# Patient Record
Sex: Female | Born: 1995 | Race: Asian | Hispanic: No | Marital: Married | Smoking: Never smoker
Health system: Southern US, Community
[De-identification: ages and names within clinical notes are randomized; demographics above are authoritative.]

## PROBLEM LIST (undated history)

## (undated) ENCOUNTER — Inpatient Hospital Stay (HOSPITAL_COMMUNITY): Payer: Self-pay

## (undated) DIAGNOSIS — Z789 Other specified health status: Secondary | ICD-10-CM

## (undated) DIAGNOSIS — F32A Depression, unspecified: Secondary | ICD-10-CM

## (undated) HISTORY — DX: Depression, unspecified: F32.A

## (undated) HISTORY — PX: NO PAST SURGERIES: SHX2092

---
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
## 2016-04-27 ENCOUNTER — Encounter: Payer: Self-pay | Admitting: Family Medicine

## 2016-04-27 ENCOUNTER — Ambulatory Visit (INDEPENDENT_AMBULATORY_CARE_PROVIDER_SITE_OTHER): Payer: BLUE CROSS/BLUE SHIELD | Admitting: Primary Care

## 2016-04-27 ENCOUNTER — Encounter: Payer: Self-pay | Admitting: Primary Care

## 2016-04-27 ENCOUNTER — Ambulatory Visit (INDEPENDENT_AMBULATORY_CARE_PROVIDER_SITE_OTHER): Payer: BLUE CROSS/BLUE SHIELD | Admitting: Family Medicine

## 2016-04-27 VITALS — BP 110/73 | HR 80 | Ht 60.0 in | Wt 104.0 lb

## 2016-04-27 VITALS — BP 98/62 | HR 83 | Temp 98.2°F | Ht 60.0 in | Wt 104.1 lb

## 2016-04-27 DIAGNOSIS — Z113 Encounter for screening for infections with a predominantly sexual mode of transmission: Secondary | ICD-10-CM

## 2016-04-27 DIAGNOSIS — Z975 Presence of (intrauterine) contraceptive device: Secondary | ICD-10-CM | POA: Diagnosis not present

## 2016-04-27 DIAGNOSIS — R1010 Upper abdominal pain, unspecified: Secondary | ICD-10-CM | POA: Diagnosis not present

## 2016-04-27 DIAGNOSIS — Z01419 Encounter for gynecological examination (general) (routine) without abnormal findings: Secondary | ICD-10-CM | POA: Diagnosis not present

## 2016-04-27 DIAGNOSIS — Z0000 Encounter for general adult medical examination without abnormal findings: Secondary | ICD-10-CM

## 2016-04-27 LAB — COMPREHENSIVE METABOLIC PANEL
ALBUMIN: 4.7 g/dL (ref 3.5–5.2)
ALK PHOS: 42 U/L — AB (ref 47–119)
ALT: 18 U/L (ref 0–35)
AST: 19 U/L (ref 0–37)
BILIRUBIN TOTAL: 0.7 mg/dL (ref 0.2–1.2)
BUN: 12 mg/dL (ref 6–23)
CALCIUM: 9.7 mg/dL (ref 8.4–10.5)
CO2: 28 mEq/L (ref 19–32)
Chloride: 105 mEq/L (ref 96–112)
Creatinine, Ser: 0.62 mg/dL (ref 0.40–1.20)
GFR: 130.77 mL/min (ref >60.00)
Glucose, Bld: 86 mg/dL (ref 70–99)
Potassium: 4.4 mEq/L (ref 3.5–5.1)
Sodium: 139 mEq/L (ref 135–145)
TOTAL PROTEIN: 7.8 g/dL (ref 6.0–8.3)

## 2016-04-27 LAB — CBC WITH DIFFERENTIAL/PLATELET
BASOS ABS: 0 10*3/uL (ref 0.0–0.1)
Basophils Relative: 0.3 % (ref 0.0–3.0)
Eosinophils Absolute: 0.1 10*3/uL (ref 0.0–0.7)
Eosinophils Relative: 1.1 % (ref 0.0–5.0)
HEMATOCRIT: 42.9 % (ref 36.0–49.0)
Hemoglobin: 14.4 g/dL (ref 12.0–16.0)
LYMPHS ABS: 1.9 10*3/uL (ref 0.7–4.0)
LYMPHS PCT: 36.8 % (ref 24.0–48.0)
MCHC: 33.6 g/dL (ref 31.0–37.0)
MCV: 87.9 fl (ref 78.0–98.0)
MONOS PCT: 7.3 % (ref 3.0–12.0)
Monocytes Absolute: 0.4 10*3/uL (ref 0.1–1.0)
NEUTROS PCT: 54.5 % (ref 43.0–71.0)
Neutro Abs: 2.8 10*3/uL (ref 1.4–7.7)
Platelets: 204 10*3/uL (ref 150.0–575.0)
RBC: 4.88 Mil/uL (ref 3.80–5.70)
RDW: 12.3 % (ref 11.4–15.5)
WBC: 5.2 10*3/uL (ref 4.5–13.5)

## 2016-04-27 LAB — H. PYLORI ANTIBODY, IGG: H Pylori IgG: NEGATIVE

## 2016-04-27 LAB — LIPASE: Lipase: 28 U/L (ref 11.0–59.0)

## 2016-04-27 NOTE — Patient Instructions (Signed)
You are eligible for HPV vaccination which is the only vaccination against cancer, specifically cervical cancer. You can get this up to the age of 54. You can often get this at your primary care office or at the health department.    Human Papillomavirus Quadrivalent Vaccine suspension for injection What is this medicine? HUMAN PAPILLOMAVIRUS VACCINE (HYOO muhn pap uh LOH muh vahy ruhs vak SEEN) is a vaccine. It is used to prevent infections of four types of the human papillomavirus. In women, the vaccine may lower your risk of getting cervical, vaginal, vulvar, or anal cancer and genital warts. In men, the vaccine may lower your risk of getting genital warts and anal cancer. You cannot get these diseases from the vaccine. This vaccine does not treat these diseases. This medicine may be used for other purposes; ask your health care provider or pharmacist if you have questions. What should I tell my health care provider before I take this medicine? They need to know if you have any of these conditions: -fever or infection -hemophilia -HIV infection or AIDS -immune system problems -low platelet count -an unusual reaction to Human Papillomavirus Vaccine, yeast, other medicines, foods, dyes, or preservatives -pregnant or trying to get pregnant -breast-feeding How should I use this medicine? This vaccine is for injection in a muscle on your upper arm or thigh. It is given by a health care professional. Bonita Quin will be observed for 15 minutes after each dose. Sometimes, fainting happens after the vaccine is given. You may be asked to sit or lie down during the 15 minutes. Three doses are given. The second dose is given 2 months after the first dose. The last dose is given 4 months after the second dose. A copy of a Vaccine Information Statement will be given before each vaccination. Read this sheet carefully each time. The sheet may change frequently. Talk to your pediatrician regarding the use of this  medicine in children. While this drug may be prescribed for children as young as 64 years of age for selected conditions, precautions do apply. Overdosage: If you think you have taken too much of this medicine contact a poison control center or emergency room at once. NOTE: This medicine is only for you. Do not share this medicine with others. What if I miss a dose? All 3 doses of the vaccine should be given within 6 months. Remember to keep appointments for follow-up doses. Your health care provider will tell you when to return for the next vaccine. Ask your health care professional for advice if you are unable to keep an appointment or miss a scheduled dose. What may interact with this medicine? -other vaccines This list may not describe all possible interactions. Give your health care provider a list of all the medicines, herbs, non-prescription drugs, or dietary supplements you use. Also tell them if you smoke, drink alcohol, or use illegal drugs. Some items may interact with your medicine. What should I watch for while using this medicine? This vaccine may not fully protect everyone. Continue to have regular pelvic exams and cervical or anal cancer screenings as directed by your doctor. The Human Papillomavirus is a sexually transmitted disease. It can be passed by any kind of sexual activity that involves genital contact. The vaccine works best when given before you have any contact with the virus. Many people who have the virus do not have any signs or symptoms. Tell your doctor or health care professional if you have any reaction or unusual symptom  after getting the vaccine. What side effects may I notice from receiving this medicine? Side effects that you should report to your doctor or health care professional as soon as possible: -allergic reactions like skin rash, itching or hives, swelling of the face, lips, or tongue -breathing problems -feeling faint or lightheaded, falls Side effects  that usually do not require medical attention (report to your doctor or health care professional if they continue or are bothersome): -cough -dizziness -fever -headache -nausea -redness, warmth, swelling, pain, or itching at site where injected This list may not describe all possible side effects. Call your doctor for medical advice about side effects. You may report side effects to FDA at 1-800-FDA-1088. Where should I keep my medicine? This drug is given in a hospital or clinic and will not be stored at home. NOTE: This sheet is a summary. It may not cover all possible information. If you have questions about this medicine, talk to your doctor, pharmacist, or health care provider.    2016, Elsevier/Gold Standard. (2013-08-19 13:14:33)

## 2016-04-27 NOTE — Assessment & Plan Note (Signed)
Mostly to epigastric region x 3-4 years. Exam today unremarkable, does not appear acutely ill. Suspect either silent reflux or gastric ulcer. Will obtain CMP, CBC, Lipase, and H Pylori for further evaluation. Will also have her trial Tums for symptoms and notify if no improvement within 2-3 weeks.

## 2016-04-27 NOTE — Progress Notes (Signed)
   CLINIC ENCOUNTER NOTE  History:  20 y.o. G0P0000 here today for Chief Complaint  Patient presents with  . Gynecologic Exam    She denies any abnormal vaginal discharge, bleeding, pelvic pain or other concerns.   History reviewed. No pertinent past medical history.  History reviewed. No pertinent surgical history.  The following portions of the patient's history were reviewed and updated as appropriate: allergies, current medications, past family history, past medical history, past social history, past surgical history and problem list.   Health Maintenance:  Pap @21 .   Review of Systems:  Pertinent items noted in HPI and remainder of comprehensive ROS otherwise negative.  Objective:  Physical Exam BP 110/73   Pulse 80   Ht 5' (1.524 m)   Wt 104 lb (47.2 kg)   BMI 20.31 kg/m  CONSTITUTIONAL: Well-developed, well-nourished female in no acute distress.  HENT:  Normocephalic, atraumatic. External right and left ear normal. Oropharynx is clear and moist EYES: Conjunctivae and EOM are normal. Pupils are equal, round, and reactive to light. No scleral icterus.  NECK: Normal range of motion, supple, no masses SKIN: Skin is warm and dry. No rash noted. Not diaphoretic. No erythema. No pallor. NEUROLGIC: Alert and oriented to person, place, and time. Normal reflexes, muscle tone coordination. No cranial nerve deficit noted. PSYCHIATRIC: Normal mood and affect. Normal behavior. Normal judgment and thought content. CARDIOVASCULAR: Normal heart rate noted RESPIRATORY: Effort and breath sounds normal, no problems with respiration noted BREAST: normal CBE with normal breast tissue. No skin changes, nipple retraction, or nipple discharge.  ABDOMEN: Soft, no distention noted.   PELVIC: Normal appearing external genitalia; normal appearing vaginal mucosa and cervix.  No abnormal discharge noted.  Normal uterine size, no other palpable masses, no uterine or adnexal  tenderness. MUSCULOSKELETAL: Normal range of motion. No edema noted.  Labs and Imaging No results found.  Assessment & Plan:  1. Well woman exam with routine gynecological exam No indication for pap smear as patient is 20yo Recommended discussing HPV vaccination with PCP or at HD Reviewed reproductive life planning, wants pregnancy at [id number]. She is currently using Nexplanon for BCM.  Recommended starting MVI with folic acid Reviewed need for Td every 10 years, will have flu shot through work next week. Encouraged to call so that we can update Epic Desires STI screening today (HIV and CT) - HIV antibody (with reflex) - RPR - GC/Chlamydia probe amp ([place])not at [facility]  Routine preventative health maintenance measures emphasized. Please refer to After Visit Summary for other counseling recommendations.   Return in about 1 year (around 04/27/2017) for Yearly physical, Nexplanon removal and replacement.

## 2016-04-27 NOTE — Progress Notes (Signed)
Pre visit review using our clinic review tool, if applicable. No additional management support is needed unless otherwise documented below in the visit note. 

## 2016-04-27 NOTE — Progress Notes (Signed)
   Subjective:    Patient ID: Alicia Rich, female    DOB: 15-Sep-1995, 20 y.o.   MRN: [id number]  HPI  Alicia Rich is a 20 year old female who presents today to establish care and discuss the problems mentioned below. Will obtain old records.  1) Abdominal Pain: Intermittently present for the past 3-4 years. She will experience pain to the epigastric region intermittently within several minutes after eating and also when she hasn't eaten in a while. She describes her pain as "crampy". The pain will last for several minutes which will dissipate after laying down. She will occasionally experience nausea with vomiting with her pain. She has experienced esophageal burning in the past. She denies diarrhea, unexplained weight loss, fevers, rectal bleeding, changes in appetite.   Review of Systems  Constitutional: Negative for fatigue, fever and unexpected weight change.  Gastrointestinal: Positive for abdominal pain. Negative for constipation, diarrhea, nausea, rectal pain and vomiting.  Neurological: Negative for weakness.       No past medical history on file.   Social History   Social History  . Marital status: Married    Spouse name: N/A  . Number of children: N/A  . Years of education: N/A   Occupational History  . Not on file.   Social History Main Topics  . Smoking status: Never Smoker  . Smokeless tobacco: Not on file  . Alcohol use No  . Drug use: Unknown  . Sexual activity: Not on file   Other Topics Concern  . Not on file   Social History Narrative   Married.   No children.   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   She aspires to take classes for nursing or cosmetology.     No past surgical history on file.  Family History  Problem Relation Age of Onset  . Heart attack Maternal Grandfather     No Known Allergies  No current outpatient prescriptions on file prior to visit.   No current facility-administered medications on file prior to visit.     BP 98/62    Pulse 83   Temp 98.2 F (36.8 C) (Oral)   Ht 5' (1.524 m)   Wt 104 lb 1.9 oz (47.2 kg)   SpO2 99%   BMI 20.33 kg/m    Objective:   Physical Exam  Constitutional: She appears well-nourished.  Neck: Neck supple.  Cardiovascular: Normal rate and regular rhythm.   Pulmonary/Chest: Effort normal and breath sounds normal.  Abdominal: Soft. Bowel sounds are normal. There is no tenderness.  Skin: Skin is warm and dry.  Psychiatric: She has a normal mood and affect.          Assessment & Plan:

## 2016-04-27 NOTE — Patient Instructions (Signed)
Your pain and symptoms could be related to silent esophageal reflux.  Start by purchasing a bottle of Tum's (antacid). Take according to the package instructions at onset of your pain. Do this for 3-4 weeks. Also avoid foods that could trigger these symptoms, take a look at the information below.  If the Tum's do not work within 3 weeks or if you need them on a daily basis then please e-mail me.   Complete lab work prior to leaving today. I will notify you of your results once received.   It was a pleasure to meet you today! Please don't hesitate to call me with any questions. Welcome to [facility]!  Food Choices for Gastroesophageal Reflux Disease, Adult When you have gastroesophageal reflux disease (GERD), the foods you eat and your eating habits are very important. Choosing the right foods can help ease the discomfort of GERD. WHAT GENERAL GUIDELINES DO I NEED TO FOLLOW?  Choose fruits, vegetables, whole grains, low-fat dairy products, and low-fat meat, fish, and poultry.  Limit fats such as oils, salad dressings, butter, nuts, and avocado.  Keep a food diary to identify foods that cause symptoms.  Avoid foods that cause reflux. These may be different for different people.  Eat frequent small meals instead of three large meals each day.  Eat your meals slowly, in a relaxed setting.  Limit fried foods.  Cook foods using methods other than frying.  Avoid drinking alcohol.  Avoid drinking large amounts of liquids with your meals.  Avoid bending over or lying down until 2-3 hours after eating. WHAT FOODS ARE NOT RECOMMENDED? The following are some foods and drinks that may worsen your symptoms: Vegetables Tomatoes. Tomato juice. Tomato and spaghetti sauce. Chili peppers. Onion and garlic. Horseradish. Fruits Oranges, grapefruit, and lemon (fruit and juice). Meats High-fat meats, fish, and poultry. This includes hot dogs, ribs, ham, sausage, salami, and bacon. Dairy Whole  milk and chocolate milk. Sour cream. Cream. Butter. Ice cream. Cream cheese.  Beverages Coffee and tea, with or without caffeine. Carbonated beverages or energy drinks. Condiments Hot sauce. Barbecue sauce.  Sweets/Desserts Chocolate and cocoa. Donuts. Peppermint and spearmint. Fats and Oils High-fat foods, including [place] fries and potato chips. Other Vinegar. Strong spices, such as black pepper, white pepper, red pepper, cayenne, curry powder, cloves, ginger, and chili powder. The items listed above may not be a complete list of foods and beverages to avoid. Contact your dietitian for more information.   This information is not intended to replace advice given to you by your health care provider. Make sure you discuss any questions you have with your health care provider.   Document Released: 06/27/2005 Document Revised: 07/18/2014 Document Reviewed: 05/01/2013 Elsevier Interactive Patient Education [employer]2016 Elsevier Inc.

## 2016-04-28 LAB — RPR

## 2016-04-28 LAB — HIV ANTIBODY (ROUTINE TESTING W REFLEX): HIV 1&2 Ab, 4th Generation: NONREACTIVE

## 2016-05-02 LAB — GC/CHLAMYDIA PROBE AMP (~~LOC~~) NOT AT ARMC
Chlamydia: NEGATIVE
Neisseria Gonorrhea: NEGATIVE

## 2016-05-11 ENCOUNTER — Telehealth: Payer: Self-pay | Admitting: Primary Care

## 2016-05-11 NOTE — Telephone Encounter (Signed)
-----   Message from Doreene NestKatherine K Jobeth Pangilinan, NP sent at 04/27/2016 10:59 AM EDT ----- Regarding: Abdominal Pain Please check on patient's abdominal pain. Any improvement with Tums?

## 2016-05-12 NOTE — Telephone Encounter (Signed)
Message left for patient to return my call.  

## 2016-05-13 NOTE — Telephone Encounter (Signed)
Message left for patient to return my call.  

## 2016-10-23 ENCOUNTER — Inpatient Hospital Stay (HOSPITAL_COMMUNITY)
Admission: AD | Admit: 2016-10-23 | Discharge: 2016-10-23 | Disposition: A | Discharge status: Home / Self Care | Payer: BLUE CROSS/BLUE SHIELD | Source: Ambulatory Visit | Attending: Obstetrics & Gynecology | Admitting: Obstetrics & Gynecology

## 2016-10-23 ENCOUNTER — Encounter (HOSPITAL_COMMUNITY): Payer: Self-pay

## 2016-10-23 DIAGNOSIS — Z978 Presence of other specified devices: Secondary | ICD-10-CM | POA: Diagnosis not present

## 2016-10-23 DIAGNOSIS — N939 Abnormal uterine and vaginal bleeding, unspecified: Secondary | ICD-10-CM | POA: Insufficient documentation

## 2016-10-23 DIAGNOSIS — N921 Excessive and frequent menstruation with irregular cycle: Secondary | ICD-10-CM | POA: Diagnosis not present

## 2016-10-23 DIAGNOSIS — Z79899 Other long term (current) drug therapy: Secondary | ICD-10-CM | POA: Diagnosis not present

## 2016-10-23 DIAGNOSIS — Z975 Presence of (intrauterine) contraceptive device: Secondary | ICD-10-CM

## 2016-10-23 LAB — URINALYSIS, ROUTINE W REFLEX MICROSCOPIC
Bacteria, UA: NONE SEEN
Bilirubin Urine: NEGATIVE
GLUCOSE, UA: NEGATIVE mg/dL
Ketones, ur: NEGATIVE mg/dL
Leukocytes, UA: NEGATIVE
NITRITE: NEGATIVE
PH: 6 (ref 5.0–8.0)
PROTEIN: NEGATIVE mg/dL
SPECIFIC GRAVITY, URINE: 1.023 (ref 1.005–1.030)

## 2016-10-23 LAB — CBC
HEMATOCRIT: 39.7 % (ref 36.0–46.0)
HEMOGLOBIN: 13.2 g/dL (ref 12.0–15.0)
MCH: 29.9 pg (ref 26.0–34.0)
MCHC: 33.2 g/dL (ref 30.0–36.0)
MCV: 90 fL (ref 78.0–100.0)
Platelets: 215 10*3/uL (ref 150–400)
RBC: 4.41 MIL/uL (ref 3.87–5.11)
RDW: 12.2 % (ref 11.5–15.5)
WBC: 4.9 10*3/uL (ref 4.0–10.5)

## 2016-10-23 LAB — POCT PREGNANCY, URINE: Preg Test, Ur: NEGATIVE

## 2016-10-23 MED ORDER — NORGESTIMATE-ETH ESTRADIOL 0.25-35 MG-MCG PO TABS: 1.0000 | ORAL_TABLET | Freq: Every day | ORAL | 1 refills | Status: DC

## 2016-10-23 NOTE — MAU Note (Signed)
Patient presents with vaginal bleeding for almost 2 months, has history of irregular bleeding since implant

## 2016-10-23 NOTE — MAU Provider Note (Signed)
Chief Complaint:  Vaginal Bleeding   First Provider Initiated Contact with Patient 10/23/16 1037      HPI: Alicia Rich is a 21 y.o. G0P0000 who presents to maternity admissions reporting prolonged vaginal bleeding. Has a Nexplanon in place for 2 years and has had irregular bleeding before but it never lasted this long.  .She reports vaginal bleeding, but no vaginal itching/burning, urinary symptoms, h/a, dizziness, n/v, or fever/chills.    Vaginal Bleeding  The patient's primary symptoms include vaginal bleeding. The patient's pertinent negatives include no genital itching, genital lesions, genital odor or pelvic pain. This is a recurrent problem. The current episode started more than 1 month ago. The problem occurs constantly. The problem has been unchanged. The patient is experiencing no pain. She is not pregnant. Pertinent negatives include no abdominal pain, back pain, constipation, diarrhea, dysuria, fever, headaches, nausea or vomiting. The vaginal discharge was bloody. The vaginal bleeding is typical of menses. She has not been passing clots. She has not been passing tissue. Nothing aggravates the symptoms. She has tried nothing for the symptoms. Her menstrual history has been irregular.   RN Note: Patient presents with vaginal bleeding for almost 2 months, has history of irregular bleeding since implant  Past Medical History: History reviewed. No pertinent past medical history.  Past obstetric history: OB History  Gravida Para Term Preterm AB Living  0 0 0 0 0 0  SAB TAB Ectopic Multiple Live Births  0 0 0 0 0        Past Surgical History: History reviewed. No pertinent surgical history.  Family History: Family History  Problem Relation Age of Onset  . Heart attack Maternal Grandfather     Social History: Social History  Substance Use Topics  . Smoking status: Never Smoker  . Smokeless tobacco: Never Used  . Alcohol use No    Allergies: No Known  Allergies  Meds:  Prescriptions Prior to Admission  Medication Sig Dispense Refill Last Dose  . acetaminophen (TYLENOL) 500 MG tablet Take 500 mg by mouth every 6 (six) hours as needed.   10/21/2016  . etonogestrel (NEXPLANON) 68 MG IMPL implant 1 each by Subdermal route once.   10/23/2016 at Unknown time    I have reviewed patient's Past Medical Hx, Surgical Hx, Family Hx, Social Hx, medications and allergies.  ROS:  Review of Systems  Constitutional: Negative for fever.  Gastrointestinal: Negative for abdominal pain, constipation, diarrhea, nausea and vomiting.  Genitourinary: Positive for vaginal bleeding. Negative for dysuria and pelvic pain.  Musculoskeletal: Negative for back pain.  Neurological: Negative for headaches.   Other systems negative     Physical Exam  Patient Vitals for the past 24 hrs:  BP Temp Temp src Pulse Resp Height Weight  10/23/16 0953 109/71 98.1 F (36.7 C) Oral 83 16 - -  10/23/16 0944 - - - - - 5' (1.524 m) 111 lb 0.6 oz (50.4 kg)   Constitutional: Well-developed, well-nourished female in no acute distress.  Cardiovascular: normal rate and rhythm, no ectopy audible, S1 & S2 heard, no murmur Respiratory: normal effort, no distress. Lungs CTAB with no wheezes or crackles GI: Abd soft, non-tender.  Nondistended.  No rebound, No guarding.  Bowel Sounds audible  MS: Extremities nontender, no edema, normal ROM Neurologic: Alert and oriented x 4.   Grossly nonfocal. GU: Neg CVAT. Skin:  Warm and Dry Psych:  Affect appropriate.  PELVIC EXAM: Cervix pink, visually closed, without lesion, small amount of bloody discharge, vaginal walls  and external genitalia normal Bimanual exam: Cervix firm, anterior, neg CMT, uterus nontender, nonenlarged, adnexa without tenderness, enlargement, or mass    Labs: Results for orders placed or performed during the hospital encounter of 10/23/16 (from the past 24 hour(s))  Urinalysis, Routine w reflex microscopic      Status: Abnormal   Collection Time: 10/23/16  9:45 AM  Result Value Ref Range   Color, Urine YELLOW YELLOW   APPearance CLEAR CLEAR   Specific Gravity, Urine 1.023 1.005 - 1.030   pH 6.0 5.0 - 8.0   Glucose, UA NEGATIVE NEGATIVE mg/dL   Hgb urine dipstick LARGE (A) NEGATIVE   Bilirubin Urine NEGATIVE NEGATIVE   Ketones, ur NEGATIVE NEGATIVE mg/dL   Protein, ur NEGATIVE NEGATIVE mg/dL   Nitrite NEGATIVE NEGATIVE   Leukocytes, UA NEGATIVE NEGATIVE   RBC / HPF 0-5 0 - 5 RBC/hpf   WBC, UA 0-5 0 - 5 WBC/hpf   Bacteria, UA NONE SEEN NONE SEEN   Squamous Epithelial / LPF 0-5 (A) NONE SEEN   Mucous PRESENT   Pregnancy, urine POC     Status: None   Collection Time: 10/23/16  9:51 AM  Result Value Ref Range   Preg Test, Ur NEGATIVE NEGATIVE  CBC     Status: None   Collection Time: 10/23/16 10:03 AM  Result Value Ref Range   WBC 4.9 4.0 - 10.5 K/uL   RBC 4.41 3.87 - 5.11 MIL/uL   Hemoglobin 13.2 12.0 - 15.0 g/dL   HCT 40.9 81.1 - 91.4 %   MCV 90.0 78.0 - 100.0 fL   MCH 29.9 26.0 - 34.0 pg   MCHC 33.2 30.0 - 36.0 g/dL   RDW 78.2 95.6 - 21.3 %   Platelets 215 150 - 400 K/uL      Imaging:  No results found.  MAU Course/MDM: I have ordered labs as follows: CBC due to prolonged bleeding, UA Imaging ordered: none Results reviewed.   Hemoglobin is good, so no need for transfusion. Reviewed irreg bleeding with Nexplanon can be normal Will try a month of OCPs to stabilize lining  Pt stable at time of discharge.  Assessment: Breakthrough bleeding on Nexplanon - Plan: Discharge patient  Nexplanon in place - Plan: Discharge patient   Plan: Discharge home Recommend patience with regimen.  Let clinic know if bleeding does not stop Rx sent for Sprintec for breakthrough bleeding   Encouraged to return here or to other Urgent Care/ED if she develops worsening of symptoms, increase in pain, fever, or other concerning symptoms.   Wynelle Bourgeois CNM, MSN Certified  Nurse-Midwife 10/23/2016 10:37 AM

## 2016-10-23 NOTE — Discharge Instructions (Signed)
Oral Contraception Use Oral contraceptive pills (OCPs) are medicines taken to prevent pregnancy. OCPs work by preventing the ovaries from releasing eggs. The hormones in OCPs also cause the cervical mucus to thicken, preventing the sperm from entering the uterus. The hormones also cause the uterine lining to become thin, not allowing a fertilized egg to attach to the inside of the uterus. OCPs are highly effective when taken exactly as prescribed. However, OCPs do not prevent sexually transmitted diseases (STDs). Safe sex practices, such as using condoms along with an OCP, can help prevent STDs. Before taking OCPs, you may have a physical exam and Pap test. Your health care provider may also order blood tests if necessary. Your health care provider will make sure you are a good candidate for oral contraception. Discuss with your health care provider the possible side effects of the OCP you may be prescribed. When starting an OCP, it can take 2 to 3 months for the body to adjust to the changes in hormone levels in your body. How to take oral contraceptive pills Your health care provider may advise you on how to start taking the first cycle of OCPs. Otherwise, you can:  Start on day 1 of your menstrual period. You will not need any backup contraceptive protection with this start time.  Start on the first Sunday after your menstrual period or the day you get your prescription. In these cases, you will need to use backup contraceptive protection for the first week.  Start the pill at any time of your cycle. If you take the pill within 5 days of the start of your period, you are protected against pregnancy right away. In this case, you will not need a backup form of birth control. If you start at any other time of your menstrual cycle, you will need to use another form of birth control for 7 days. If your OCP is the type called a minipill, it will protect you from pregnancy after taking it for 2 days (48  hours). After you have started taking OCPs:  If you forget to take 1 pill, take it as soon as you remember. Take the next pill at the regular time.  If you miss 2 or more pills, call your health care provider because different pills have different instructions for missed doses. Use backup birth control until your next menstrual period starts.  If you use a 28-day pack that contains inactive pills and you miss 1 of the last 7 pills (pills with no hormones), it will not matter. Throw away the rest of the non-hormone pills and start a new pill pack. No matter which day you start the OCP, you will always start a new pack on that same day of the week. Have an extra pack of OCPs and a backup contraceptive method available in case you miss some pills or lose your OCP pack. Follow these instructions at home:  Do not smoke.  Always use a condom to protect against STDs. OCPs do not protect against STDs.  Use a calendar to mark your menstrual period days.  Read the information and directions that came with your OCP. Talk to your health care provider if you have questions. Contact a health care provider if:  You develop nausea and vomiting.  You have abnormal vaginal discharge or bleeding.  You develop a rash.  You miss your menstrual period.  You are losing your hair.  You need treatment for mood swings or depression.  You get dizzy  when taking the OCP.  You develop acne from taking the OCP.  You become pregnant. Get help right away if:  You develop chest pain.  You develop shortness of breath.  You have an uncontrolled or severe headache.  You develop numbness or slurred speech.  You develop visual problems.  You develop pain, redness, and swelling in the legs. This information is not intended to replace advice given to you by your health care provider. Make sure you discuss any questions you have with your health care provider. Document Released: 06/16/2011 Document Revised:  12/03/2015 Document Reviewed: 12/16/2012 Elsevier Interactive Patient Education  2017 [employer] Abnormal Uterine Bleeding Abnormal uterine bleeding means bleeding more than usual from your uterus. It can include:  Bleeding between periods.  Bleeding after sex.  Bleeding that is heavier than normal.  Periods that last longer than usual.  Bleeding after you have stopped having your period (menopause). There are many problems that may cause this. You should see a doctor for any kind of bleeding that is not normal. Treatment depends on the cause of the bleeding. Follow these instructions at home:  Watch your condition for any changes.  Do not use tampons, douche, or have sex, if your doctor tells you not to.  Change your pads often.  Get regular well-woman exams. Make sure they include a pelvic exam and cervical cancer screening.  Keep all follow-up visits as told by your doctor. This is important. Contact a doctor if:  The bleeding lasts more than one week.  You feel dizzy at times.  You feel like you are going to throw up (nauseous).  You throw up. Get help right away if:  You pass out.  You have to change pads every hour.  You have belly (abdominal) pain.  You have a fever.  You get sweaty.  You get weak.  You passing large blood clots from your vagina. Summary  Abnormal uterine bleeding means bleeding more than usual from your uterus.  There are many problems that may cause this. You should see a doctor for any kind of bleeding that is not normal.  Treatment depends on the cause of the bleeding. This information is not intended to replace advice given to you by your health care provider. Make sure you discuss any questions you have with your health care provider. Document Released: 04/24/2009 Document Revised: 06/21/2016 Document Reviewed: 06/21/2016 Elsevier Interactive Patient Education  2017 [employer].

## 2016-12-09 ENCOUNTER — Ambulatory Visit (INDEPENDENT_AMBULATORY_CARE_PROVIDER_SITE_OTHER): Payer: BLUE CROSS/BLUE SHIELD | Admitting: Obstetrics and Gynecology

## 2016-12-09 ENCOUNTER — Encounter: Payer: Self-pay | Admitting: Obstetrics and Gynecology

## 2016-12-09 VITALS — BP 122/75 | HR 90 | Resp 18 | Ht 61.0 in | Wt 110.0 lb

## 2016-12-09 DIAGNOSIS — Z3046 Encounter for surveillance of implantable subdermal contraceptive: Secondary | ICD-10-CM

## 2016-12-09 MED ORDER — NORGESTIMATE-ETH ESTRADIOL 0.25-35 MG-MCG PO TABS: 1.0000 | ORAL_TABLET | Freq: Every day | ORAL | 3 refills | Status: DC

## 2016-12-09 NOTE — Procedures (Signed)
Nexplanon Removal Procedure Note Patient has had nexplanon for about two years and had some AUB but very minor and rare, until she went to [facility] in mid April. At that time, she had been having constant AUB and she was put on Sprintec, which she states helped and she had a regular period on the placebo week. She'd like to just do OCPs and remove the Nexplanon. No h/o migraines, h/o VTE, FHx VTE.  Prior to the procedure being performed, the patient was asked to state their full name, date of birth, type of procedure being performed and the exact location of the operative site. This information was then checked against the documentation in the patient's chart. Prior to the procedure being performed, a "time out" was performed by the physician that confirmed the correct patient, procedure and site.  After informed consent was obtained, the Nexplanon was easily palpable at the prior insertion site. The area was swabbed with alcohol and injected with 3mL of 1% lidocaine with epi. It was then swabbed with betadine and sterile gloves put on. An 11 blade incision was made at the old site. The nexplanon was easily brought up to the surface the capsule scrapped off with the blade and then easily removed with the hemostat; it was inspected and noted to be intact.  Steri strip placed and 2x2 with pressure bandage.   Patient told to do OCPs x 1 week since Nexplanon is out before considering it effective.   The patient tolerated the procedure well.  Cornelia Copaharlie Hillery Zachman, Jr MD Attending [facility](Faculty Practice)

## 2017-07-26 ENCOUNTER — Ambulatory Visit (INDEPENDENT_AMBULATORY_CARE_PROVIDER_SITE_OTHER): Payer: BLUE CROSS/BLUE SHIELD | Admitting: Obstetrics and Gynecology

## 2017-07-26 ENCOUNTER — Encounter: Payer: Self-pay | Admitting: Obstetrics and Gynecology

## 2017-07-26 VITALS — BP 99/67 | HR 87 | Wt 107.2 lb

## 2017-07-26 DIAGNOSIS — Z124 Encounter for screening for malignant neoplasm of cervix: Secondary | ICD-10-CM | POA: Diagnosis not present

## 2017-07-26 DIAGNOSIS — Z01419 Encounter for gynecological examination (general) (routine) without abnormal findings: Secondary | ICD-10-CM

## 2017-07-26 NOTE — Progress Notes (Signed)
ytol

## 2017-07-27 LAB — CYTOLOGY - PAP: Diagnosis: NEGATIVE

## 2017-07-29 NOTE — Progress Notes (Signed)
GYNECOLOGY CLINIC ANNUAL PREVENTATIVE CARE ENCOUNTER NOTE  Subjective:   Kaidance Pantoja is a 22 y.o. G0P0000 female here for a routine annual gynecologic exam.  Current complaints: none.  She had a Nexplanon, because she and her spouse desire to get pregnant this year. Denies abnormal vaginal bleeding, discharge, pelvic pain, problems with intercourse or other gynecologic concerns.    Gynecologic History LMP: 07/19/2017 per pt.  Patient has had an implant. Contraception: none Last Pap: never had. Results were: n/a Last mammogram: never had. Results were: n/a  Obstetric History OB History  Gravida Para Term Preterm AB Living  0 0 0 0 0 0  SAB TAB Ectopic Multiple Live Births  0 0 0 0 0        History reviewed. No pertinent past medical history.  History reviewed. No pertinent surgical history.  Current Outpatient Medications on File Prior to Visit  Medication Sig Dispense Refill  . acetaminophen (TYLENOL) 500 MG tablet Take 500 mg by mouth every 6 (six) hours as needed.    . norgestimate-ethinyl estradiol (ORTHO-CYCLEN,SPRINTEC,PREVIFEM) 0.25-35 MG-MCG tablet Take 1 tablet by mouth daily. (Patient not taking: Reported on 07/26/2017) 3 Package 3   No current facility-administered medications on file prior to visit.     No Known Allergies  Social History   Socioeconomic History  . Marital status: Married    Spouse name: Earna Coder  . Number of children: Not on file  . Years of education: Not on file  . Highest education level: Not on file  Social Needs  . Financial resource strain: Not on file  . Food insecurity - worry: Not on file  . Food insecurity - inability: Not on file  . Transportation needs - medical: Not on file  . Transportation needs - non-medical: Not on file  Occupational History  . Not on file  Tobacco Use  . Smoking status: Never Smoker  . Smokeless tobacco: Never Used  Substance and Sexual Activity  . Alcohol use: No  . Drug use: No  . Sexual  activity: Yes    Birth control/protection: Implant  Other Topics Concern  . Not on file  Social History Narrative   Married.   No children.   Works as a Lawyer for [employer].   She aspires to take classes for nursing or cosmetology.     Family History  Problem Relation Age of Onset  . Heart attack Maternal Grandfather     The following portions of the patient's history were reviewed and updated as appropriate: allergies, current medications, past family history, past medical history, past social history, past surgical history and problem list.  Review of Systems Constitutional: negative Eyes: negative Ears, nose, mouth, throat, and face: negative Respiratory: negative Cardiovascular: negative Gastrointestinal: negative Genitourinary:negative Integument/breast: negative Hematologic/lymphatic: negative Musculoskeletal:negative Neurological: negative Behavioral/Psych: negative Endocrine: negative Allergic/Immunologic: negative   Objective:  BP 99/67   Pulse 87   Wt 107 lb 3.2 oz (48.6 kg)   BMI 20.26 kg/m  CONSTITUTIONAL: Well-developed, well-nourished female in no acute distress.  HENT:  Normocephalic, atraumatic, External right and left ear normal. Oropharynx is clear and moist EYES: Conjunctivae and EOM are normal. Pupils are equal, round, and reactive to light. No scleral icterus.  NECK: Normal range of motion, supple, no masses.  Normal thyroid.  SKIN: Skin is warm and dry. No rash noted. Not diaphoretic. No erythema. No pallor. NEUROLGIC: Alert and oriented to person, place, and time. Normal reflexes, muscle tone coordination. No cranial nerve deficit noted. PSYCHIATRIC: Normal  mood and affect. Normal behavior. Normal judgment and thought content. CARDIOVASCULAR: Normal heart rate noted, regular rhythm RESPIRATORY: Clear to auscultation bilaterally. Effort and breath sounds normal, no problems with respiration noted. BREASTS: Symmetric in size. No masses, skin  changes, nipple drainage, or lymphadenopathy. ABDOMEN: Soft, normal bowel sounds, no distention noted.  No tenderness, rebound or guarding.  PELVIC: Normal appearing external genitalia; normal appearing vaginal mucosa and cervix.  No abnormal discharge noted.  Pap smear obtained.  Normal uterine size, no other palpable masses, no uterine or adnexal tenderness. MUSCULOSKELETAL: Normal range of motion. No tenderness.  No cyanosis, clubbing, or edema.  2+ distal pulses.   Assessment:  Annual gynecologic examination with pap smear   Plan:   Will follow up results of pap smear and manage accordingly. Mammogram not needed d/t age Routine preventative health maintenance measures emphasized. Advised to call office with (+) HPT. Please refer to After Visit Summary for other counseling recommendations.   Raelyn MoraRolitta Jaice Lague, CNM  07/26/2017 4:00 PM

## 2017-08-01 ENCOUNTER — Ambulatory Visit: Payer: Self-pay | Admitting: Nurse Practitioner

## 2017-08-01 VITALS — BP 108/78 | HR 98 | Temp 98.0°F | Resp 20 | Ht 61.5 in | Wt 108.6 lb

## 2017-08-01 DIAGNOSIS — Z111 Encounter for screening for respiratory tuberculosis: Secondary | ICD-10-CM

## 2017-08-01 DIAGNOSIS — Z0000 Encounter for general adult medical examination without abnormal findings: Secondary | ICD-10-CM

## 2017-08-01 NOTE — Patient Instructions (Addendum)
Health Maintenance, Female Adopting a healthy lifestyle and getting preventive care can go a long way to promote health and wellness. Talk with your health care provider about what schedule of regular examinations is right for you. This is a good chance for you to check in with your provider about disease prevention and staying healthy. In between checkups, there are plenty of things you can do on your own. Experts have done a lot of research about which lifestyle changes and preventive measures are most likely to keep you healthy. Ask your health care provider for more information. Weight and diet Eat a healthy diet  Be sure to include plenty of vegetables, fruits, low-fat dairy products, and lean protein.  Do not eat a lot of foods high in solid fats, added sugars, or salt.  Get regular exercise. This is one of the most important things you can do for your health. ? Most adults should exercise for at least 150 minutes each week. The exercise should increase your heart rate and make you sweat (moderate-intensity exercise). ? Most adults should also do strengthening exercises at least twice a week. This is in addition to the moderate-intensity exercise.  Maintain a healthy weight  Body mass index (BMI) is a measurement that can be used to identify possible weight problems. It estimates body fat based on height and weight. Your health care provider can help determine your BMI and help you achieve or maintain a healthy weight.  For females 20 years of age and older: ? A BMI below 18.5 is considered underweight. ? A BMI of 18.5 to 24.9 is normal. ? A BMI of 25 to 29.9 is considered overweight. ? A BMI of 30 and above is considered obese.  Watch levels of cholesterol and blood lipids  You should start having your blood tested for lipids and cholesterol at 20 years of age, then have this test every 5 years.  You may need to have your cholesterol levels checked more often if: ? Your lipid or  cholesterol levels are high. ? You are older than 21 years of age. ? You are at high risk for heart disease.  Cancer screening Lung Cancer  Lung cancer screening is recommended for adults 55-80 years old who are at high risk for lung cancer because of a history of smoking.  A yearly low-dose CT scan of the lungs is recommended for people who: ? Currently smoke. ? Have quit within the past 15 years. ? Have at least a 30-pack-year history of smoking. A pack year is smoking an average of one pack of cigarettes a day for 1 year.  Yearly screening should continue until it has been 15 years since you quit.  Yearly screening should stop if you develop a health problem that would prevent you from having lung cancer treatment.  Breast Cancer  Practice breast self-awareness. This means understanding how your breasts normally appear and feel.  It also means doing regular breast self-exams. Let your health care provider know about any changes, no matter how small.  If you are in your 20s or 30s, you should have a clinical breast exam (CBE) by a health care provider every 1-3 years as part of a regular health exam.  If you are 40 or older, have a CBE every year. Also consider having a breast X-ray (mammogram) every year.  If you have a family history of breast cancer, talk to your health care provider about genetic screening.  If you are at high risk   for breast cancer, talk to your health care provider about having an MRI and a mammogram every year.  Breast cancer gene (BRCA) assessment is recommended for women who have family members with BRCA-related cancers. BRCA-related cancers include: ? Breast. ? Ovarian. ? Tubal. ? Peritoneal cancers.  Results of the assessment will determine the need for genetic counseling and BRCA1 and BRCA2 testing.  Cervical Cancer Your health care provider may recommend that you be screened regularly for cancer of the pelvic organs (ovaries, uterus, and  vagina). This screening involves a pelvic examination, including checking for microscopic changes to the surface of your cervix (Pap test). You may be encouraged to have this screening done every 3 years, beginning at age 22.  For women ages 56-65, health care providers may recommend pelvic exams and Pap testing every 3 years, or they may recommend the Pap and pelvic exam, combined with testing for human papilloma virus (HPV), every 5 years. Some types of HPV increase your risk of cervical cancer. Testing for HPV may also be done on women of any age with unclear Pap test results.  Other health care providers may not recommend any screening for nonpregnant women who are considered low risk for pelvic cancer and who do not have symptoms. Ask your health care provider if a screening pelvic exam is right for you.  If you have had past treatment for cervical cancer or a condition that could lead to cancer, you need Pap tests and screening for cancer for at least 20 years after your treatment. If Pap tests have been discontinued, your risk factors (such as having a new sexual partner) need to be reassessed to determine if screening should resume. Some women have medical problems that increase the chance of getting cervical cancer. In these cases, your health care provider may recommend more frequent screening and Pap tests.  Colorectal Cancer  This type of cancer can be detected and often prevented.  Routine colorectal cancer screening usually begins at 21 years of age and continues through 21 years of age.  Your health care provider may recommend screening at an earlier age if you have risk factors for colon cancer.  Your health care provider may also recommend using home test kits to check for hidden blood in the stool.  A small camera at the end of a tube can be used to examine your colon directly (sigmoidoscopy or colonoscopy). This is done to check for the earliest forms of colorectal  cancer.  Routine screening usually begins at age 33.  Direct examination of the colon should be repeated every 5-10 years through 21 years of age. However, you may need to be screened more often if early forms of precancerous polyps or small growths are found.  Skin Cancer  Check your skin from head to toe regularly.  Tell your health care provider about any new moles or changes in moles, especially if there is a change in a mole's shape or color.  Also tell your health care provider if you have a mole that is larger than the size of a pencil eraser.  Always use sunscreen. Apply sunscreen liberally and repeatedly throughout the day.  Protect yourself by wearing long sleeves, pants, a wide-brimmed hat, and sunglasses whenever you are outside.  Heart disease, diabetes, and high blood pressure  High blood pressure causes heart disease and increases the risk of stroke. High blood pressure is more likely to develop in: ? People who have blood pressure in the high end of  the normal range (130-139/85-89 mm Hg). ? People who are overweight or obese. ? People who are African American.  If you are 21-29 years of age, have your blood pressure checked every 3-5 years. If you are 3 years of age or older, have your blood pressure checked every year. You should have your blood pressure measured twice-once when you are at a hospital or clinic, and once when you are not at a hospital or clinic. Record the average of the two measurements. To check your blood pressure when you are not at a hospital or clinic, you can use: ? An automated blood pressure machine at a pharmacy. ? A home blood pressure monitor.  If you are between 17 years and 37 years old, ask your health care provider if you should take aspirin to prevent strokes.  Have regular diabetes screenings. This involves taking a blood sample to check your fasting blood sugar level. ? If you are at a normal weight and have a low risk for diabetes,  have this test once every three years after 21 years of age. ? If you are overweight and have a high risk for diabetes, consider being tested at a younger age or more often. Preventing infection Hepatitis B  If you have a higher risk for hepatitis B, you should be screened for this virus. You are considered at high risk for hepatitis B if: ? You were born in a country where hepatitis B is common. Ask your health care provider which countries are considered high risk. ? Your parents were born in a high-risk country, and you have not been immunized against hepatitis B (hepatitis B vaccine). ? You have HIV or AIDS. ? You use needles to inject street drugs. ? You live with someone who has hepatitis B. ? You have had sex with someone who has hepatitis B. ? You get hemodialysis treatment. ? You take certain medicines for conditions, including cancer, organ transplantation, and autoimmune conditions.  Hepatitis C  Blood testing is recommended for: ? Everyone born from [birth year] through 1964. ? Anyone with known risk factors for hepatitis C.  Sexually transmitted infections (STIs)  You should be screened for sexually transmitted infections (STIs) including gonorrhea and chlamydia if: ? You are sexually active and are younger than 21 years of age. ? You are older than 21 years of age and your health care provider tells you that you are at risk for this type of infection. ? Your sexual activity has changed since you were last screened and you are at an increased risk for chlamydia or gonorrhea. Ask your health care provider if you are at risk.  If you do not have HIV, but are at risk, it may be recommended that you take a prescription medicine daily to prevent HIV infection. This is called pre-exposure prophylaxis (PrEP). You are considered at risk if: ? You are sexually active and do not regularly use condoms or know the HIV status of your partner(s). ? You take drugs by injection. ? You are  sexually active with a partner who has HIV.  Talk with your health care provider about whether you are at high risk of being infected with HIV. If you choose to begin PrEP, you should first be tested for HIV. You should then be tested every 3 months for as long as you are taking PrEP. Pregnancy  If you are premenopausal and you may become pregnant, ask your health care provider about preconception counseling.  If you may become  pregnant, take 400 to 800 micrograms (mcg) of folic acid every day.  If you want to prevent pregnancy, talk to your health care provider about birth control (contraception). Osteoporosis and menopause  Osteoporosis is a disease in which the bones lose minerals and strength with aging. This can result in serious bone fractures. Your risk for osteoporosis can be identified using a bone density scan.  If you are 65 years of age or older, or if you are at risk for osteoporosis and fractures, ask your health care provider if you should be screened.  Ask your health care provider whether you should take a calcium or vitamin D supplement to lower your risk for osteoporosis.  Menopause may have certain physical symptoms and risks.  Hormone replacement therapy may reduce some of these symptoms and risks. Talk to your health care provider about whether hormone replacement therapy is right for you. Follow these instructions at home:  Schedule regular health, dental, and eye exams.  Stay current with your immunizations.  Do not use any tobacco products including cigarettes, chewing tobacco, or electronic cigarettes.  If you are pregnant, do not drink alcohol.  If you are breastfeeding, limit how much and how often you drink alcohol.  Limit alcohol intake to no more than 1 drink per day for nonpregnant women. One drink equals 12 ounces of beer, 5 ounces of wine, or 1 ounces of hard liquor.  Do not use street drugs.  Do not share needles.  Ask your health care  provider for help if you need support or information about quitting drugs.  Tell your health care provider if you often feel depressed.  Tell your health care provider if you have ever been abused or do not feel safe at home. This information is not intended to replace advice given to you by your health care provider. Make sure you discuss any questions you have with your health care provider. Document Released: 01/10/2011 Document Revised: 12/03/2015 Document Reviewed: 03/31/2015 Elsevier Interactive Patient Education  2018 [employer]       Preparing for Pregnancy If you are considering becoming pregnant, make an appointment to see your regular health care provider to learn how to prepare for a safe and healthy pregnancy (preconception care). During a preconception care visit, your health care provider will:  Do a complete physical exam, including a Pap test.  Take a complete medical history.  Give you information, answer your questions, and help you resolve problems.  Preconception checklist Medical history  Tell your health care provider about any current or past medical conditions. Your pregnancy or your ability to become pregnant may be affected by chronic conditions, such as diabetes, chronic hypertension, and thyroid problems.  Include your family's medical history as well as your partner's medical history.  Tell your health care provider about any history of STIs (sexually transmitted infections).These can affect your pregnancy. In some cases, they can be passed to your baby. Discuss any concerns that you have about STIs.  If indicated, discuss the benefits of genetic testing. This testing will show whether there are any genetic conditions that may be passed from you or your partner to your baby.  Tell your health care provider about: ? Any problems you have had with conception or pregnancy. ? Any medicines you take. These include vitamins, herbal supplements, and  over-the-counter medicines. ? Your history of immunizations. Discuss any vaccinations that you may need.  Diet  Ask your health care provider what to include in a healthy   diet that has a balance of nutrients. This is especially important when you are pregnant or preparing to become pregnant.  Ask your health care provider to help you reach a healthy weight before pregnancy. ? If you are overweight, you may be at higher risk for certain complications, such as high blood pressure, diabetes, and preterm birth. ? If you are underweight, you are more likely to have a baby who has a low birth weight.  Lifestyle, work, and home  Let your health care provider know: ? About any lifestyle habits that you have, such as alcohol use, drug use, or smoking. ? About recreational activities that may put you at risk during pregnancy, such as downhill skiing and certain exercise programs. ? Tell your health care provider about any international travel, especially any travel to places with an active Zika virus outbreak. ? About harmful substances that you may be exposed to at work or at home. These include chemicals, pesticides, radiation, or even litter boxes. ? If you do not feel safe at home.  Mental health  Tell your health care provider about: ? Any history of mental health conditions, including feelings of depression, sadness, or anxiety. ? Any medicines that you take for a mental health condition. These include herbs and supplements.  Home instructions to prepare for pregnancy Lifestyle  Eat a balanced diet. This includes fresh fruits and vegetables, whole grains, lean meats, low-fat dairy products, healthy fats, and foods that are high in fiber. Ask to meet with a nutritionist or registered dietitian for assistance with meal planning and goals.  Get regular exercise. Try to be active for at least 30 minutes a day on most days of the week. Ask your health care provider which activities are safe  during pregnancy.  Do not use any products that contain nicotine or tobacco, such as cigarettes and e-cigarettes. If you need help quitting, ask your health care provider.  Do not drink alcohol.  Do not take illegal drugs.  Maintain a healthy weight. Ask your health care provider what weight range is right for you.  General instructions  Keep an accurate record of your menstrual periods. This makes it easier for your health care provider to determine your baby's due date.  Begin taking prenatal vitamins and folic acid supplements daily as directed by your health care provider.  Manage any chronic conditions, such as high blood pressure and diabetes, as told by your health care provider. This is important.  How do I know that I am pregnant? You may be pregnant if you have been sexually active and you miss your period. Symptoms of early pregnancy include:  Mild cramping.  Very light vaginal bleeding (spotting).  Feeling unusually tired.  Nausea and vomiting (morning sickness).  If you have any of these symptoms and you suspect that you might be pregnant, you can take a home pregnancy test. These tests check for a hormone in your urine (human chorionic gonadotropin, or hCG). A woman's body begins to make this hormone during early pregnancy. These tests are very accurate. Wait until at least the first day after you miss your period to take one. If the test shows that you are pregnant (you get a positive result), call your health care provider to make an appointment for prenatal care. What should I do if I become pregnant?  Make an appointment with your health care provider as soon as you suspect you are pregnant.  Do not use any products that contain nicotine, such as   cigarettes, chewing tobacco, and e-cigarettes. If you need help quitting, ask your health care provider.  Do not drink alcoholic beverages. Alcohol is related to a number of birth defects.  Avoid toxic odors and  chemicals.  You may continue to have sexual intercourse if it does not cause pain or other problems, such as vaginal bleeding. This information is not intended to replace advice given to you by your health care provider. Make sure you discuss any questions you have with your health care provider. Document Released: 06/09/2008 Document Revised: 02/23/2016 Document Reviewed: 01/17/2016 Elsevier Interactive Patient Education  2018 [employer]  

## 2017-08-01 NOTE — Progress Notes (Signed)
Subjective:  Alicia Rich is a 22 y.o. female who presents for basic physical exam. Patient denies any current health related concerns. 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  Patient denies any c/o today.  Patient does not have any PMH and no allergies to foods or medications. Patient stopped birth control on 1/10 as she and spouse are trying to conceive. PPD placement also needed today.  There is no immunization history on file for this patient.  No past medical history on file.  No past surgical history on file.  Social History   Tobacco Use  . Smoking status: Never Smoker  . Smokeless tobacco: Never Used  Substance Use Topics  . Alcohol use: No  . Drug use: No    No Known Allergies  Current Outpatient Medications  Medication Sig Dispense Refill  . acetaminophen (TYLENOL) 500 MG tablet Take 500 mg by mouth every 6 (six) hours as needed.    . norgestimate-ethinyl estradiol (ORTHO-CYCLEN,SPRINTEC,PREVIFEM) 0.25-35 MG-MCG tablet Take 1 tablet by mouth daily. (Patient not taking: Reported on 07/26/2017) 3 Package 3   No current facility-administered medications for this visit.     Review of Systems  Constitutional: Negative.   HENT: Negative.   Eyes: Negative.   Respiratory: Negative.   Cardiovascular: Negative.   Gastrointestinal: Negative.   Genitourinary: Negative.   Musculoskeletal: Negative.   Skin: Negative.   Neurological: Negative.   Endo/Heme/Allergies: Negative.   Psychiatric/Behavioral: Negative.      Objective:  BP 108/78 (BP Location: Right Arm, Patient Position: Sitting, Cuff Size: Normal)   Pulse 98   Temp 98 F (36.7 C) (Oral)   Resp 20   Ht 5' 1.5" (1.562 m)   Wt 108 lb 9.6 oz (49.3 kg)   SpO2 100%   BMI 20.19 kg/m   General Appearance:  Alert, cooperative, no distress, appears stated age  Head:  Normocephalic, without obvious abnormality, atraumatic  Eyes:  PERRL, conjunctiva/corneas clear, EOM's intact, fundi benign, both  eyes  Ears:  Normal TM's and external ear canals, both ears  Nose: Nares normal, septum midline,mucosa normal, no drainage or sinus tenderness  Throat: Lips, mucosa, and tongue normal; teeth and gums normal  Neck: Supple, symmetrical, trachea midline, no adenopathy;  thyroid: not enlarged, symmetric, no tenderness/mass/nodules; no carotid bruit or JVD  Back:   Symmetric, no curvature, ROM normal, no CVA tenderness  Lungs:   Clear to auscultation bilaterally, respirations unlabored  Breasts:  No masses or tenderness  Heart:  Regular rate and rhythm, S1 and S2 normal, no murmur, rub, or gallop  Abdomen:   Soft, non-tender, bowel sounds active all four quadrants,  no masses, no organomegaly  Pelvic: Deferred  Extremities: Extremities normal, atraumatic, no cyanosis or edema  Pulses: 2+ and symmetric  Skin: Skin color, texture, turgor normal, no rashes or lesions  Lymph nodes: Cervical and submandibular nodes normal  Neurologic: Normal        Assessment:  basic physical exam    Plan:  Patient education provided.  No labs needed at this time. Patient will follow up with PCP.  Patient needs immunization records for verification before health assessment form can be released.  Patient will return with immunization records.  Need provider to verify immunizations are UTD before releasing form to the patient.  Patient also made aware. Patient education provided regarding health maintenance and planning for pregnancy.

## 2017-08-03 LAB — TB SKIN TEST
Induration: 0 mm
TB SKIN TEST: NEGATIVE

## 2017-09-18 ENCOUNTER — Inpatient Hospital Stay (HOSPITAL_COMMUNITY): Payer: BLUE CROSS/BLUE SHIELD

## 2017-09-18 ENCOUNTER — Inpatient Hospital Stay (HOSPITAL_COMMUNITY)
Admission: AD | Admit: 2017-09-18 | Discharge: 2017-09-18 | Disposition: A | Discharge status: Home / Self Care | Payer: BLUE CROSS/BLUE SHIELD | Source: Ambulatory Visit | Attending: Obstetrics & Gynecology | Admitting: Obstetrics & Gynecology

## 2017-09-18 ENCOUNTER — Encounter (HOSPITAL_COMMUNITY): Payer: Self-pay | Admitting: *Deleted

## 2017-09-18 DIAGNOSIS — Z3A09 9 weeks gestation of pregnancy: Secondary | ICD-10-CM | POA: Diagnosis not present

## 2017-09-18 DIAGNOSIS — O209 Hemorrhage in early pregnancy, unspecified: Secondary | ICD-10-CM

## 2017-09-18 LAB — URINALYSIS, ROUTINE W REFLEX MICROSCOPIC
BILIRUBIN URINE: NEGATIVE
GLUCOSE, UA: NEGATIVE mg/dL
HGB URINE DIPSTICK: NEGATIVE
KETONES UR: NEGATIVE mg/dL
Nitrite: NEGATIVE
PH: 6 (ref 5.0–8.0)
Protein, ur: NEGATIVE mg/dL
SPECIFIC GRAVITY, URINE: 1.014 (ref 1.005–1.030)

## 2017-09-18 LAB — CBC
HCT: 38.1 % (ref 36.0–46.0)
Hemoglobin: 12.9 g/dL (ref 12.0–15.0)
MCH: 30 pg (ref 26.0–34.0)
MCHC: 33.9 g/dL (ref 30.0–36.0)
MCV: 88.6 fL (ref 78.0–100.0)
PLATELETS: 282 10*3/uL (ref 150–400)
RBC: 4.3 MIL/uL (ref 3.87–5.11)
RDW: 12.3 % (ref 11.5–15.5)
WBC: 10.5 10*3/uL (ref 4.0–10.5)

## 2017-09-18 LAB — HCG, QUANTITATIVE, PREGNANCY: HCG, BETA CHAIN, QUANT, S: 224760 m[IU]/mL — AB (ref <5)

## 2017-09-18 LAB — WET PREP, GENITAL
Clue Cells Wet Prep HPF POC: NONE SEEN
SPERM: NONE SEEN
Trich, Wet Prep: NONE SEEN
Yeast Wet Prep HPF POC: NONE SEEN

## 2017-09-18 LAB — ABO/RH: ABO/RH(D): O POS

## 2017-09-18 LAB — POCT PREGNANCY, URINE: PREG TEST UR: POSITIVE — AB

## 2017-09-18 NOTE — MAU Note (Signed)
Pt reports episode of heavy bleeding on Friday. Since then she has not had any bleeding. Reports no pain.

## 2017-09-18 NOTE — Discharge Instructions (Signed)
Keep your appointments as scheduled.

## 2017-09-18 NOTE — MAU Provider Note (Signed)
History     CSN: [id number]  Arrival date and time: 09/18/17 1056   First Provider Initiated Contact with Patient 09/18/17 1234      Chief Complaint  Patient presents with  . Possible Pregnancy  . Vaginal Bleeding   HPI Alicia Rich 22 y.o. 7825w2d  Came to MAU as she was having vaginal bleeding on Friday and Saturday.  [facility]  Is worried about the pregnancy.   OB History    Gravida Para Term Preterm AB Living   1 0 0 0 0 0   SAB TAB Ectopic Multiple Live Births   0 0 0 0 0      History reviewed. No pertinent past medical history.  History reviewed. No pertinent surgical history.  Family History  Problem Relation Age of Onset  . Heart attack Maternal Grandfather     Social History   Tobacco Use  . Smoking status: Never Smoker  . Smokeless tobacco: Never Used  Substance Use Topics  . Alcohol use: No  . Drug use: No    Allergies: No Known Allergies  Medications Prior to Admission  Medication Sig Dispense Refill Last Dose  . Prenatal Vit-Fe Fumarate-FA (PRENATAL MULTIVITAMIN) TABS tablet Take 1 tablet by mouth daily at 12 noon.   09/17/2017 at Unknown time    Review of Systems  Constitutional: Negative for fever.  Gastrointestinal: Negative for abdominal pain, nausea and vomiting.  Genitourinary: Positive for vaginal bleeding. Negative for dysuria and vaginal discharge.   Physical Exam   Blood pressure (!) 110/59, pulse 97, temperature 99.2 F (37.3 C), temperature source Oral, resp. rate 18, height 5\' 1"  (1.549 m), weight 111 lb (50.3 kg), last menstrual period 07/15/2017, SpO2 100 %.  Physical Exam  Nursing note and vitals reviewed. Constitutional: She is oriented to person, place, and time. She appears well-developed and well-nourished.  HENT:  Head: Normocephalic.  Eyes: EOM are normal.  Neck: Neck supple.  GI: Soft. There is no tenderness. There is no rebound and no guarding.  Unable to feel fundus  and unable to hear FHT.  Genitourinary:  Genitourinary Comments: Speculum exam: Vagina - Small amount of white discharge, no odor, no bleeding seen Cervix - No contact bleeding Bimanual exam: Cervix closed Uterus non tender, 8 week size Adnexa non tender, no masses bilaterally GC/Chlam, wet prep done Chaperone present for exam.   Musculoskeletal: Normal range of motion.  Neurological: She is alert and oriented to person, place, and time.  Skin: Skin is warm and dry.  Psychiatric: She has a normal mood and affect.    MAU Course  Procedures Results for orders placed or performed during the hospital encounter of 09/18/17 (from the past 24 hour(s))  Urinalysis, Routine w reflex microscopic     Status: Abnormal   Collection Time: 09/18/17 12:24 PM  Result Value Ref Range   Color, Urine YELLOW YELLOW   APPearance HAZY (A) CLEAR   Specific Gravity, Urine 1.014 1.005 - 1.030   pH 6.0 5.0 - 8.0   Glucose, UA NEGATIVE NEGATIVE mg/dL   Hgb urine dipstick NEGATIVE NEGATIVE   Bilirubin Urine NEGATIVE NEGATIVE   Ketones, ur NEGATIVE NEGATIVE mg/dL   Protein, ur NEGATIVE NEGATIVE mg/dL   Nitrite NEGATIVE NEGATIVE   Leukocytes, UA MODERATE (A) NEGATIVE   RBC / HPF 0-5 0 - 5 RBC/hpf   WBC, UA 6-30 0 - 5 WBC/hpf   Bacteria, UA FEW (A) NONE SEEN   Squamous Epithelial /  LPF 0-5 (A) NONE SEEN   Mucus PRESENT   CBC     Status: None   Collection Time: 09/18/17 12:24 PM  Result Value Ref Range   WBC 10.5 4.0 - 10.5 K/uL   RBC 4.30 3.87 - 5.11 MIL/uL   Hemoglobin 12.9 12.0 - 15.0 g/dL   HCT 13.0 86.5 - 78.4 %   MCV 88.6 78.0 - 100.0 fL   MCH 30.0 26.0 - 34.0 pg   MCHC 33.9 30.0 - 36.0 g/dL   RDW 69.6 29.5 - 28.4 %   Platelets 282 150 - 400 K/uL  hCG, quantitative, pregnancy     Status: Abnormal   Collection Time: 09/18/17 12:24 PM  Result Value Ref Range   hCG, Beta Chain, Quant, S 224,760 (H) <5 mIU/mL  ABO/Rh     Status: None   Collection Time: 09/18/17 12:24 PM  Result Value Ref  Range   ABO/RH(D)      O POS Performed at [facility], [address] [place], [place] [address]   Pregnancy, urine POC     Status: Abnormal   Collection Time: 09/18/17 12:25 PM  Result Value Ref Range   Preg Test, Ur POSITIVE (A) NEGATIVE  Wet prep, genital     Status: Abnormal   Collection Time: 09/18/17 12:45 PM  Result Value Ref Range   Yeast Wet Prep HPF POC NONE SEEN NONE SEEN   Trich, Wet Prep NONE SEEN NONE SEEN   Clue Cells Wet Prep HPF POC NONE SEEN NONE SEEN   WBC, Wet Prep HPF POC MANY (A) NONE SEEN   Sperm NONE SEEN     MDM IUP at 9 w0d with EDC 04-23-18, no reason for vaginal bleeding seen on ultrasound and no bleeding today.  Assessment and Plan  Vaginal bleeding first trimester - none today.  Plan Keep your appointment to begin [facility] care as scheduled. Let them review your labs and US done today.  Alicia Rich 09/18/2017, 1:24 PM

## 2017-09-19 LAB — GC/CHLAMYDIA PROBE AMP (~~LOC~~) NOT AT ARMC
CHLAMYDIA, DNA PROBE: NEGATIVE
NEISSERIA GONORRHEA: NEGATIVE

## 2017-09-19 LAB — RPR: RPR Ser Ql: NONREACTIVE

## 2017-09-19 LAB — HIV ANTIBODY (ROUTINE TESTING W REFLEX): HIV SCREEN 4TH GENERATION: NONREACTIVE

## 2017-09-20 ENCOUNTER — Encounter: Payer: Self-pay | Admitting: Obstetrics and Gynecology

## 2017-09-20 ENCOUNTER — Ambulatory Visit (INDEPENDENT_AMBULATORY_CARE_PROVIDER_SITE_OTHER): Payer: BLUE CROSS/BLUE SHIELD | Admitting: Obstetrics and Gynecology

## 2017-09-20 VITALS — BP 109/71 | HR 72 | Wt 102.0 lb

## 2017-09-20 DIAGNOSIS — Z23 Encounter for immunization: Secondary | ICD-10-CM | POA: Diagnosis not present

## 2017-09-20 DIAGNOSIS — Z3401 Encounter for supervision of normal first pregnancy, first trimester: Secondary | ICD-10-CM

## 2017-09-20 DIAGNOSIS — Z3400 Encounter for supervision of normal first pregnancy, unspecified trimester: Secondary | ICD-10-CM | POA: Insufficient documentation

## 2017-09-20 NOTE — Progress Notes (Signed)
New OB Note  09/20/2017   Clinic: [facility]  Chief Complaint: NOB  Transfer of Care Patient: no  History of Present Illness: [name] is a 22 y.o. G1 @ 9/4 weeks (EDC 10/12, based on Patient's last menstrual period was 07/15/2017.=9wk u/s).  Preg complicated by has Well adult exam and Supervision of normal first pregnancy, antepartum on their problem list.   Any events prior to today's visit: yes, she had some bleeding and went to MAU a few days ago. Rh pos and SLIUP with normal FHR on u/s Her periods were: regular She was using no method when she conceived; she stopped it a few months prior She has Negative signs or symptoms of miscarriage or preterm labor On any medications around the time she conceived/early pregnancy: No   ROS: A 12-point review of systems was performed and negative, except as stated in the above HPI.  OBGYN History: As per HPI. OB History  Gravida Para Term Preterm AB Living  1 0 0 0 0 0  SAB TAB Ectopic Multiple Live Births  0 0 0 0 0    # Outcome Date GA Lbr Len/2nd Weight Sex Delivery Anes PTL Lv  1 Current               Any issues with any prior pregnancies: not applicable Prior children are healthy, doing well, and without any problems or issues: not applicable History of pap smears: Yes. Last pap smear 2019 and results were NILM  Past Medical History: History reviewed. No pertinent past medical history.  Past Surgical History: History reviewed. No pertinent surgical history.  Family History:  Family History  Problem Relation Age of Onset  . Heart attack Maternal Grandfather    She denies any history of mental retardation, birth defects or genetic disorders in her or the FOB's history  Social History:  Social History   Socioeconomic History  . Marital status: Married    Spouse name: Not on file  . Number of children: Not on file  . Years of education: Not on file  . Highest education level: Not on  file  Social Needs  . Financial resource strain: Not on file  . Food insecurity - worry: Not on file  . Food insecurity - inability: Not on file  . Transportation needs - medical: Not on file  . Transportation needs - non-medical: Not on file  Occupational History  . Not on file  Tobacco Use  . Smoking status: Never Smoker  . Smokeless tobacco: Never Used  Substance and Sexual Activity  . Alcohol use: No  . Drug use: No  . Sexual activity: Not Currently  Other Topics Concern  . Not on file  Social History Narrative   Married.   No children.   Works as a Lawyer for [employer].   She aspires to take classes for nursing or cosmetology.      Allergy: No Known Allergies  Health Maintenance:  Mammogram Up to Date: not applicable  Current Outpatient Medications: PNV  Physical Exam: BP 109/71   Pulse 72   Wt 102 lb (46.3 kg)   LMP 07/15/2017   BMI 19.27 kg/m  Body mass index is 19.27 kg/m.  Fundal height: not applicable FHTs: 180s  General appearance: Well nourished, well developed female in no acute distress.   Laboratory: 3/11 MAU labs negative wet prpe, gc/ct, hiv, rpr, cbc  Imaging:  SLIUP on bedside u/s with FHR in the 180s and subj normal  appearing fluid, +FM  Assessment: pt doing well  Plan: 1. Encounter for supervision of normal first pregnancy in first trimester Routine care. Pt to consider getting genetics nv - Obstetric Panel, Including HIV - Urine Culture   Problem list reviewed and updated.  Follow up in 3 weeks.  >50% of 25 min visit spent on counseling and coordination of care.     Cornelia Copaharlie Madisen Ludvigsen, Jr. MD Attending [facility](Faculty Practice)

## 2017-09-21 LAB — OBSTETRIC PANEL, INCLUDING HIV
ANTIBODY SCREEN: NEGATIVE
Basophils Absolute: 0 10*3/uL (ref 0.0–0.2)
Basos: 0 %
EOS (ABSOLUTE): 0.1 10*3/uL (ref 0.0–0.4)
Eos: 1 %
HEMATOCRIT: 39.1 % (ref 34.0–46.6)
HEMOGLOBIN: 12.9 g/dL (ref 11.1–15.9)
HEP B S AG: NEGATIVE
HIV Screen 4th Generation wRfx: NONREACTIVE
IMMATURE GRANS (ABS): 0 10*3/uL (ref 0.0–0.1)
Immature Granulocytes: 0 %
LYMPHS: 22 %
Lymphocytes Absolute: 1.7 10*3/uL (ref 0.7–3.1)
MCH: 29.7 pg (ref 26.6–33.0)
MCHC: 33 g/dL (ref 31.5–35.7)
MCV: 90 fL (ref 79–97)
MONOCYTES: 8 %
Monocytes Absolute: 0.6 10*3/uL (ref 0.1–0.9)
Neutrophils Absolute: 5.5 10*3/uL (ref 1.4–7.0)
Neutrophils: 69 %
Platelets: 288 10*3/uL (ref 150–379)
RBC: 4.35 x10E6/uL (ref 3.77–5.28)
RDW: 12.6 % (ref 12.3–15.4)
RPR: NONREACTIVE
RUBELLA: 1.55 {index} (ref >0.99)
Rh Factor: POSITIVE
WBC: 7.9 10*3/uL (ref 3.4–10.8)

## 2017-09-21 LAB — URINE CULTURE: ORGANISM ID, BACTERIA: NO GROWTH

## 2017-10-11 ENCOUNTER — Ambulatory Visit (INDEPENDENT_AMBULATORY_CARE_PROVIDER_SITE_OTHER): Payer: BLUE CROSS/BLUE SHIELD | Admitting: Family Medicine

## 2017-10-11 ENCOUNTER — Other Ambulatory Visit: Payer: BLUE CROSS/BLUE SHIELD

## 2017-10-11 VITALS — BP 118/77 | HR 72 | Wt 110.0 lb

## 2017-10-11 DIAGNOSIS — Z3400 Encounter for supervision of normal first pregnancy, unspecified trimester: Secondary | ICD-10-CM

## 2017-10-11 NOTE — Progress Notes (Signed)
Ask about NIPS, Carrier screening, BabyScripts

## 2017-10-11 NOTE — Patient Instructions (Signed)
Back Exercises The following exercises strengthen the muscles that help to support the back. They also help to keep the lower back flexible. Doing these exercises can help to prevent back pain or lessen existing pain. If you have back pain or discomfort, try doing these exercises 2-3 times each day or as told by your health care provider. When the pain goes away, do them once each day, but increase the number of times that you repeat the steps for each exercise (do more repetitions). If you do not have back pain or discomfort, do these exercises once each day or as told by your health care provider. Exercises Single Knee to Chest  Repeat these steps 3-5 times for each leg: 1. Lie on your back on a firm bed or the floor with your legs extended. 2. Bring one knee to your chest. Your other leg should stay extended and in contact with the floor. 3. Hold your knee in place by grabbing your knee or thigh. 4. Pull on your knee until you feel a gentle stretch in your lower back. 5. Hold the stretch for 10-30 seconds. 6. Slowly release and straighten your leg.  Pelvic Tilt  Repeat these steps 5-10 times: 1. Lie on your back on a firm bed or the floor with your legs extended. 2. Bend your knees so they are pointing toward the ceiling and your feet are flat on the floor. 3. Tighten your lower abdominal muscles to press your lower back against the floor. This motion will tilt your pelvis so your tailbone points up toward the ceiling instead of pointing to your feet or the floor. 4. With gentle tension and even breathing, hold this position for 5-10 seconds.  Cat-Cow  Repeat these steps until your lower back becomes more flexible: 1. Get into a hands-and-knees position on a firm surface. Keep your hands under your shoulders, and keep your knees under your hips. You may place padding under your knees for comfort. 2. Let your head hang down, and point your tailbone toward the floor so your lower back  becomes rounded like the back of a cat. 3. Hold this position for 5 seconds. 4. Slowly lift your head and point your tailbone up toward the ceiling so your back forms a sagging arch like the back of a cow. 5. Hold this position for 5 seconds.   Bridges  Repeat these steps 10 times: 1. Lie on your back on a firm surface. 2. Bend your knees so they are pointing toward the ceiling and your feet are flat on the floor. 3. Tighten your buttocks muscles and lift your buttocks off of the floor until your waist is at almost the same height as your knees. You should feel the muscles working in your buttocks and the back of your thighs. If you do not feel these muscles, slide your feet 1-2 inches farther away from your buttocks. 4. Hold this position for 3-5 seconds. 5. Slowly lower your hips to the starting position, and allow your buttocks muscles to relax completely.  If this exercise is too easy, try doing it with your arms crossed over your chest.   Figure 4 Stretch   Contact a health care provider if:  Your back pain or discomfort gets much worse when you do an exercise.  Your back pain or discomfort does not lessen within 2 hours after you exercise. If you have any of these problems, stop doing these exercises right away. Do not do them again unless  your health care provider says that you can. Get help right away if:  You develop sudden, severe back pain. If this happens, stop doing the exercises right away. Do not do them again unless your health care provider says that you can. This information is not intended to replace advice given to you by your health care provider. Make sure you discuss any questions you have with your health care provider. Document Released: 08/04/2004 Document Revised: 11/04/2015 Document Reviewed: 08/21/2014 Elsevier Interactive Patient Education  2017 [employer].

## 2017-10-11 NOTE — Progress Notes (Signed)
No baby scripts

## 2017-10-11 NOTE — Progress Notes (Signed)
   PRENATAL VISIT NOTE  Subjective:  Alicia Rich is a 22 y.o. G1P0000 at 2162w4d being seen today for ongoing prenatal care.  She is currently monitored for the following issues for this low-risk pregnancy and has Supervision of normal first pregnancy, antepartum on their problem list.  Patient reports no complaints.   .  .  Movement: Absent. Denies leaking of fluid.   The following portions of the patient's history were reviewed and updated as appropriate: allergies, current medications, past family history, past medical history, past social history, past surgical history and problem list. Problem list updated.  Objective:   Vitals:   10/11/17 1035  BP: 118/77  Pulse: 72  Weight: 110 lb (49.9 kg)    Fetal Status: Fetal Heart Rate (bpm): 171LBS   Movement: Absent     General:  Alert, oriented and cooperative. Patient is in no acute distress.  Skin: Skin is warm and dry. No rash noted.   Cardiovascular: Normal heart rate noted  Respiratory: Normal respiratory effort, no problems with respiration noted  Abdomen: Soft, gravid, appropriate for gestational age.  Pain/Pressure: Absent     Pelvic: Cervical exam deferred        Extremities: Normal range of motion.     Mental Status: Normal mood and affect. Normal behavior. Normal judgment and thought content.   Assessment and Plan:  Pregnancy: G1P0000 at 5262w4d  1. Supervision of normal first pregnancy, antepartum - reviewed normal discomforts of pregnancy - Genetic Screening - Cystic Fibrosis Mutation 97 - SMN1 Copy Number Analysis  Preterm labor symptoms and general obstetric precautions including but not limited to vaginal bleeding, contractions, leaking of fluid and fetal movement were reviewed in detail with the patient. Please refer to After Visit Summary for other counseling recommendations.  Return in about 1 month (around 11/08/2017) for Routine prenatal care.  No future appointments.  Federico FlakeKimberly Niles Pistol Kessenich, MD

## 2017-10-18 ENCOUNTER — Encounter: Payer: Self-pay | Admitting: Radiology

## 2017-10-19 LAB — SMN1 COPY NUMBER ANALYSIS (SMA CARRIER SCREENING)

## 2017-10-19 LAB — CYSTIC FIBROSIS MUTATION 97: Interpretation: NOT DETECTED

## 2017-10-23 ENCOUNTER — Encounter: Payer: Self-pay | Admitting: Radiology

## 2017-11-08 ENCOUNTER — Ambulatory Visit (INDEPENDENT_AMBULATORY_CARE_PROVIDER_SITE_OTHER): Payer: BLUE CROSS/BLUE SHIELD | Admitting: Family Medicine

## 2017-11-08 ENCOUNTER — Encounter: Payer: Self-pay | Admitting: Family Medicine

## 2017-11-08 VITALS — BP 109/73 | HR 72 | Wt 108.0 lb

## 2017-11-08 DIAGNOSIS — O219 Vomiting of pregnancy, unspecified: Secondary | ICD-10-CM | POA: Insufficient documentation

## 2017-11-08 DIAGNOSIS — Z3400 Encounter for supervision of normal first pregnancy, unspecified trimester: Secondary | ICD-10-CM

## 2017-11-08 MED ORDER — DOXYLAMINE-PYRIDOXINE ER 20-20 MG PO TBCR
20.0000 mg | EXTENDED_RELEASE_TABLET | Freq: Two times a day (BID) | ORAL | 1 refills | Status: DC
Start: 2017-11-08 — End: 2018-02-21

## 2017-11-08 MED ORDER — DOXYLAMINE-PYRIDOXINE ER 20-20 MG PO TBCR: 20.0000 mg | EXTENDED_RELEASE_TABLET | Freq: Two times a day (BID) | ORAL | 1 refills | Status: DC

## 2017-11-08 NOTE — Progress Notes (Signed)
   PRENATAL VISIT NOTE  Subjective:  Alicia Rich is a 22 y.o. G1P0000 at [redacted]w[redacted]d being seen today for ongoing prenatal care.  She is currently monitored for the following issues for this low-risk pregnancy and has Supervision of normal first pregnancy, antepartum and Nausea and vomiting during pregnancy prior to [redacted] weeks gestation on their problem list.  Patient reports no complaints.  Contractions: Not present. Vag. Bleeding: None.  Movement: Present. Denies leaking of fluid.   The following portions of the patient's history were reviewed and updated as appropriate: allergies, current medications, past family history, past medical history, past social history, past surgical history and problem list. Problem list updated.  Objective:   Vitals:   11/08/17 1042  BP: 109/73  Pulse: 72  Weight: 108 lb (49 kg)    Fetal Status: Fetal Heart Rate (bpm): 164   Movement: Present     General:  Alert, oriented and cooperative. Patient is in no acute distress.  Skin: Skin is warm and dry. No rash noted.   Cardiovascular: Normal heart rate noted  Respiratory: Normal respiratory effort, no problems with respiration noted  Abdomen: Soft, gravid, appropriate for gestational age.  Pain/Pressure: Absent     Pelvic: Cervical exam deferred        Extremities: Normal range of motion.     Mental Status: Normal mood and affect. Normal behavior. Normal judgment and thought content.   Assessment and Plan:  Pregnancy: G1P0000 at [redacted]w[redacted]d  1. Nausea and vomiting during pregnancy prior to [redacted] weeks gestation - Failed OTC medications, has lost 2lbs due to nausea and vomiting. When she used a trial of medication she needed did not vomit.  Recommended 4 consistent weeks of treatment and then slow decrease.  - Doxylamine-Pyridoxine ER (BONJESTA) 20-20 MG TBCR; Take 20 mg by mouth 2 (two) times daily.  Dispense: 60 tablet; Refill: 1  2. Supervision of normal first pregnancy, antepartum UTD currently Recommended  childbirth classes and reviewed in real time on computer how to register, printed out information as well.   Preterm labor symptoms and general obstetric precautions including but not limited to vaginal bleeding, contractions, leaking of fluid and fetal movement were reviewed in detail with the patient. Please refer to After Visit Summary for other counseling recommendations.  Return in about 1 month (around 12/06/2017) for Routine prenatal care.  Future Appointments  Date Time Provider Department Center  11/24/2017 10:45 AM [facility] [place] 2 [facility]  12/06/2017 10:30 AM Federico Flake, MD [facility]    Federico Flake, MD

## 2017-11-24 ENCOUNTER — Observation Stay (HOSPITAL_COMMUNITY)
Admission: RE | Admit: 2017-11-24 | Discharge: 2017-11-24 | Disposition: A | Discharge status: Home / Self Care | Payer: BLUE CROSS/BLUE SHIELD | Source: Ambulatory Visit | Attending: Family Medicine | Admitting: Family Medicine

## 2017-11-24 ENCOUNTER — Other Ambulatory Visit: Payer: Self-pay | Admitting: Family Medicine

## 2017-11-24 DIAGNOSIS — Z3492 Encounter for supervision of normal pregnancy, unspecified, second trimester: Secondary | ICD-10-CM | POA: Diagnosis not present

## 2017-11-24 DIAGNOSIS — Z3400 Encounter for supervision of normal first pregnancy, unspecified trimester: Secondary | ICD-10-CM | POA: Diagnosis present

## 2017-11-24 DIAGNOSIS — Z3A18 18 weeks gestation of pregnancy: Secondary | ICD-10-CM

## 2017-11-24 DIAGNOSIS — Z363 Encounter for antenatal screening for malformations: Secondary | ICD-10-CM | POA: Insufficient documentation

## 2017-11-24 DIAGNOSIS — Z0000 Encounter for general adult medical examination without abnormal findings: Secondary | ICD-10-CM | POA: Insufficient documentation

## 2017-12-06 ENCOUNTER — Encounter: Payer: Self-pay | Admitting: Family Medicine

## 2017-12-06 ENCOUNTER — Ambulatory Visit (INDEPENDENT_AMBULATORY_CARE_PROVIDER_SITE_OTHER): Payer: BLUE CROSS/BLUE SHIELD | Admitting: Family Medicine

## 2017-12-06 VITALS — BP 106/71 | HR 72 | Wt 111.4 lb

## 2017-12-06 DIAGNOSIS — Z3400 Encounter for supervision of normal first pregnancy, unspecified trimester: Secondary | ICD-10-CM

## 2017-12-06 NOTE — Progress Notes (Signed)
Doing good  

## 2017-12-06 NOTE — Progress Notes (Signed)
   PRENATAL VISIT NOTE  Subjective:  Alicia Rich is a 22 y.o. G1P0000 at [redacted]w[redacted]d being seen today for ongoing prenatal care.  She is currently monitored for the following issues for this low-risk pregnancy and has Supervision of normal first pregnancy, antepartum; Nausea and vomiting during pregnancy prior to [redacted] weeks gestation; [redacted] weeks gestation of pregnancy; and Encounter for antenatal screening for malformation on their problem list.  Patient reports no complaints.  Contractions: Not present.  .  Movement: Present. Denies leaking of fluid.   The following portions of the patient's history were reviewed and updated as appropriate: allergies, current medications, past family history, past medical history, past social history, past surgical history and problem list. Problem list updated.  Objective:   Vitals:   12/06/17 1050  BP: 106/71  Pulse: 72  Weight: 111 lb 6.4 oz (50.5 kg)    Fetal Status: Fetal Heart Rate (bpm): 159   Movement: Present     General:  Alert, oriented and cooperative. Patient is in no acute distress.  Skin: Skin is warm and dry. No rash noted.   Cardiovascular: Normal heart rate noted  Respiratory: Normal respiratory effort, no problems with respiration noted  Abdomen: Soft, gravid, appropriate for gestational age.  Pain/Pressure: Absent     Pelvic: Cervical exam deferred        Extremities: Normal range of motion.     Mental Status: Normal mood and affect. Normal behavior. Normal judgment and thought content.   Assessment and Plan:  Pregnancy: G1P0000 at [redacted]w[redacted]d  1. Supervision of normal first pregnancy, antepartum - UTD - Discussed BF today and infant feeding plan - [place] MFM OB FOLLOW UP; Future  Preterm labor symptoms and general obstetric precautions including but not limited to vaginal bleeding, contractions, leaking of fluid and fetal movement were reviewed in detail with the patient. Please refer to After Visit Summary for other counseling  recommendations.  Return in about 1 month (around 01/03/2018) for Routine prenatal care.  No future appointments.  Federico Flake, MD

## 2017-12-06 NOTE — Patient Instructions (Signed)

## 2018-01-10 ENCOUNTER — Ambulatory Visit (INDEPENDENT_AMBULATORY_CARE_PROVIDER_SITE_OTHER): Payer: BLUE CROSS/BLUE SHIELD | Admitting: Obstetrics & Gynecology

## 2018-01-10 ENCOUNTER — Other Ambulatory Visit: Payer: Self-pay | Admitting: Family Medicine

## 2018-01-10 ENCOUNTER — Ambulatory Visit (HOSPITAL_COMMUNITY)
Admission: RE | Admit: 2018-01-10 | Discharge: 2018-01-10 | Disposition: A | Discharge status: Home / Self Care | Payer: BLUE CROSS/BLUE SHIELD | Source: Ambulatory Visit | Attending: Family Medicine | Admitting: Family Medicine

## 2018-01-10 ENCOUNTER — Encounter: Payer: Self-pay | Admitting: Obstetrics & Gynecology

## 2018-01-10 VITALS — BP 104/68 | HR 85 | Wt 118.4 lb

## 2018-01-10 DIAGNOSIS — Z3400 Encounter for supervision of normal first pregnancy, unspecified trimester: Secondary | ICD-10-CM

## 2018-01-10 DIAGNOSIS — Z3402 Encounter for supervision of normal first pregnancy, second trimester: Secondary | ICD-10-CM

## 2018-01-10 DIAGNOSIS — Z3A25 25 weeks gestation of pregnancy: Secondary | ICD-10-CM | POA: Diagnosis not present

## 2018-01-10 DIAGNOSIS — Z362 Encounter for other antenatal screening follow-up: Secondary | ICD-10-CM

## 2018-01-10 NOTE — Progress Notes (Signed)
   PRENATAL VISIT NOTE  Subjective:  Alicia Rich is a 22 y.o. G1P0000 at 531w4d being seen today for ongoing prenatal care.  She is currently monitored for the following issues for this low-risk pregnancy and has Supervision of normal first pregnancy, antepartum; Nausea and vomiting during pregnancy prior to [redacted] weeks gestation; [redacted] weeks gestation of pregnancy; and Encounter for antenatal screening for malformation on their problem list.  Patient reports no complaints.  Contractions: Not present. Vag. Bleeding: None.  Movement: Present. Denies leaking of fluid.   The following portions of the patient's history were reviewed and updated as appropriate: allergies, current medications, past family history, past medical history, past social history, past surgical history and problem list. Problem list updated.  Objective:   Vitals:   01/10/18 1029  BP: 104/68  Pulse: 85  Weight: 118 lb 6.4 oz (53.7 kg)    Fetal Status: Fetal Heart Rate (bpm): 153   Movement: Present     General:  Alert, oriented and cooperative. Patient is in no acute distress.  Skin: Skin is warm and dry. No rash noted.   Cardiovascular: Normal heart rate noted  Respiratory: Normal respiratory effort, no problems with respiration noted  Abdomen: Soft, gravid, appropriate for gestational age.  Pain/Pressure: Absent     Pelvic: Cervical exam deferred        Extremities: Normal range of motion.  Edema: None  Mental Status: Normal mood and affect. Normal behavior. Normal judgment and thought content.   Assessment and Plan:  Pregnancy: G1P0000 at 6731w4d  1. Supervision of normal first pregnancy, antepartum   Preterm labor symptoms and general obstetric precautions including but not limited to vaginal bleeding, contractions, leaking of fluid and fetal movement were reviewed in detail with the patient. Please refer to After Visit Summary for other counseling recommendations.    Return in about 3 weeks (around  01/31/2018) for 2 hour GTT.  Future Appointments  Date Time Provider Department Center  01/10/2018 12:45 PM [facility] US 2 [facility]    Allie BossierMyra C Lyniah Fujita, MD

## 2018-01-31 ENCOUNTER — Ambulatory Visit (INDEPENDENT_AMBULATORY_CARE_PROVIDER_SITE_OTHER): Payer: BLUE CROSS/BLUE SHIELD | Admitting: Obstetrics & Gynecology

## 2018-01-31 ENCOUNTER — Encounter: Payer: Self-pay | Admitting: Obstetrics & Gynecology

## 2018-01-31 VITALS — BP 105/66 | HR 72 | Wt 121.8 lb

## 2018-01-31 DIAGNOSIS — Z23 Encounter for immunization: Secondary | ICD-10-CM | POA: Diagnosis not present

## 2018-01-31 DIAGNOSIS — Z3482 Encounter for supervision of other normal pregnancy, second trimester: Secondary | ICD-10-CM

## 2018-01-31 DIAGNOSIS — Z3400 Encounter for supervision of normal first pregnancy, unspecified trimester: Secondary | ICD-10-CM

## 2018-01-31 LAB — CBC
HEMATOCRIT: 32.9 % — AB (ref 34.0–46.6)
Hemoglobin: 10.7 g/dL — ABNORMAL LOW (ref 11.1–15.9)
MCH: 30.1 pg (ref 26.6–33.0)
MCHC: 32.5 g/dL (ref 31.5–35.7)
MCV: 93 fL (ref 79–97)
Platelets: 296 10*3/uL (ref 150–450)
RBC: 3.55 x10E6/uL — AB (ref 3.77–5.28)
RDW: 12 % — AB (ref 12.3–15.4)
WBC: 10.3 10*3/uL (ref 3.4–10.8)

## 2018-01-31 NOTE — Progress Notes (Signed)
   PRENATAL VISIT NOTE  Subjective:  Alicia Rich is a 22 y.o. G1P0000 at 4371w4d being seen today for ongoing prenatal care.  She is currently monitored for the following issues for this low-risk pregnancy and has Supervision of normal first pregnancy, antepartum; Nausea and vomiting during pregnancy prior to [redacted] weeks gestation; [redacted] weeks gestation of pregnancy; and Encounter for antenatal screening for malformation on their problem list.  Patient reports no complaints.  Contractions: Not present.  .  Movement: Present. Denies leaking of fluid.   The following portions of the patient's history were reviewed and updated as appropriate: allergies, current medications, past family history, past medical history, past social history, past surgical history and problem list. Problem list updated.  Objective:   Vitals:   01/31/18 0845  BP: 105/66  Pulse: 72  Weight: 121 lb 12.8 oz (55.2 kg)    Fetal Status: Fetal Heart Rate (bpm): 154   Movement: Present     General:  Alert, oriented and cooperative. Patient is in no acute distress.  Skin: Skin is warm and dry. No rash noted.   Cardiovascular: Normal heart rate noted  Respiratory: Normal respiratory effort, no problems with respiration noted  Abdomen: Soft, gravid, appropriate for gestational age.  Pain/Pressure: Absent     Pelvic: Cervical exam deferred        Extremities: Normal range of motion.     Mental Status: Normal mood and affect. Normal behavior. Normal judgment and thought content.   Assessment and Plan:  Pregnancy: G1P0000 at 11071w4d  1. Encounter for supervision of other normal pregnancy in second trimester  - Glucose Tolerance, 2 Hours w/1 Hour - RPR - HIV antibody - CBC  2. Supervision of normal first pregnancy, antepartum   Preterm labor symptoms and general obstetric precautions including but not limited to vaginal bleeding, contractions, leaking of fluid and fetal movement were reviewed in detail with the  patient. Please refer to After Visit Summary for other counseling recommendations.  No follow-ups on file.  No future appointments.  Allie BossierMyra C Akeema Broder, MD

## 2018-01-31 NOTE — Addendum Note (Signed)
Addended by: Areta HaberMOREHEAD, Arbutus Nelligan B on: 01/31/2018 04:41 PM   Modules accepted: Orders

## 2018-01-31 NOTE — Progress Notes (Signed)
28 weeks lab today tdap today

## 2018-02-01 ENCOUNTER — Other Ambulatory Visit: Payer: Self-pay | Admitting: Obstetrics & Gynecology

## 2018-02-01 ENCOUNTER — Encounter: Payer: Self-pay | Admitting: Obstetrics & Gynecology

## 2018-02-01 DIAGNOSIS — O99019 Anemia complicating pregnancy, unspecified trimester: Secondary | ICD-10-CM | POA: Insufficient documentation

## 2018-02-01 LAB — HIV ANTIBODY (ROUTINE TESTING W REFLEX): HIV Screen 4th Generation wRfx: NONREACTIVE

## 2018-02-01 LAB — GLUCOSE TOLERANCE, 2 HOURS W/ 1HR
GLUCOSE, 2 HOUR: 91 mg/dL (ref 65–152)
GLUCOSE, FASTING: 64 mg/dL — AB (ref 65–91)
Glucose, 1 hour: 86 mg/dL (ref 65–179)

## 2018-02-01 LAB — RPR: RPR Ser Ql: NONREACTIVE

## 2018-02-01 MED ORDER — FERROUS SULFATE 325 (65 FE) MG PO TABS: 325.0000 mg | ORAL_TABLET | Freq: Every day | ORAL | 3 refills | Status: DC

## 2018-02-01 NOTE — Progress Notes (Unsigned)
Ferrous sulfate prescribed for anemia I sent a mychart message to patient about this.

## 2018-02-05 ENCOUNTER — Telehealth: Payer: Self-pay

## 2018-02-05 NOTE — Telephone Encounter (Signed)
Patient called stating she was having some pain over the weekend but since then the pain has went away. Advised patient to try tylenol, drink plenty of water and if no relief can call us back.

## 2018-02-21 ENCOUNTER — Ambulatory Visit (INDEPENDENT_AMBULATORY_CARE_PROVIDER_SITE_OTHER): Payer: BLUE CROSS/BLUE SHIELD | Admitting: Family Medicine

## 2018-02-21 VITALS — BP 109/69 | HR 94 | Wt 128.0 lb

## 2018-02-21 DIAGNOSIS — Z3400 Encounter for supervision of normal first pregnancy, unspecified trimester: Secondary | ICD-10-CM

## 2018-02-21 DIAGNOSIS — O99013 Anemia complicating pregnancy, third trimester: Secondary | ICD-10-CM

## 2018-02-21 DIAGNOSIS — O99019 Anemia complicating pregnancy, unspecified trimester: Secondary | ICD-10-CM

## 2018-02-21 DIAGNOSIS — O219 Vomiting of pregnancy, unspecified: Secondary | ICD-10-CM

## 2018-02-21 DIAGNOSIS — D649 Anemia, unspecified: Secondary | ICD-10-CM

## 2018-02-21 DIAGNOSIS — Z3403 Encounter for supervision of normal first pregnancy, third trimester: Secondary | ICD-10-CM

## 2018-02-21 LAB — CBC
HEMATOCRIT: 33.5 % — AB (ref 34.0–46.6)
Hemoglobin: 11.2 g/dL (ref 11.1–15.9)
MCH: 30.4 pg (ref 26.6–33.0)
MCHC: 33.4 g/dL (ref 31.5–35.7)
MCV: 91 fL (ref 79–97)
Platelets: 288 10*3/uL (ref 150–450)
RBC: 3.68 x10E6/uL — ABNORMAL LOW (ref 3.77–5.28)
RDW: 13 % (ref 12.3–15.4)
WBC: 9.4 10*3/uL (ref 3.4–10.8)

## 2018-02-21 NOTE — Progress Notes (Signed)
   PRENATAL VISIT NOTE  Subjective:  Alicia Rich is a 22 y.o. G1P0000 at 4427w4d being seen today for ongoing prenatal care.  She is currently monitored for the following issues for this low-risk pregnancy and has Supervision of normal first pregnancy, antepartum; Nausea and vomiting during pregnancy prior to [redacted] weeks gestation; and Anemia affecting pregnancy, antepartum on their problem list.  Patient reports no complaints.  Contractions: Irregular. Vag. Bleeding: None.  Movement: Present. Denies leaking of fluid.   The following portions of the patient's history were reviewed and updated as appropriate: allergies, current medications, past family history, past medical history, past social history, past surgical history and problem list. Problem list updated.  Objective:   Vitals:   02/21/18 1037  BP: 109/69  Pulse: 94  Weight: 128 lb (58.1 kg)    Fetal Status: Fetal Heart Rate (bpm): 163 Fundal Height: 31 cm Movement: Present     General:  Alert, oriented and cooperative. Patient is in no acute distress.  Skin: Skin is warm and dry. No rash noted.   Cardiovascular: Normal heart rate noted  Respiratory: Normal respiratory effort, no problems with respiration noted  Abdomen: Soft, gravid, appropriate for gestational age.  Pain/Pressure: Absent     Pelvic: Cervical exam deferred        Extremities: Normal range of motion.  Edema: None  Mental Status: Normal mood and affect. Normal behavior. Normal judgment and thought content.   Assessment and Plan:  Pregnancy: G1P0000 at 727w4d  1. Anemia affecting pregnancy, antepartum - CBC - Currently taking fe once daily - If continued low value, recommend increase Fe to BID.  2. Supervision of normal first pregnancy, antepartum UTD  3. Nausea and vomiting during pregnancy prior to [redacted] weeks gestation Resolved   Preterm labor symptoms and general obstetric precautions including but not limited to vaginal bleeding, contractions,  leaking of fluid and fetal movement were reviewed in detail with the patient. Please refer to After Visit Summary for other counseling recommendations.  Return in about 2 weeks (around 03/07/2018) for Routine prenatal care.  Future Appointments  Date Time Provider Department Center  03/07/2018 10:45 AM Federico FlakeNewton, Khaleef Ruby Niles, MD [facility]    Federico FlakeKimberly Niles Tarri Guilfoil, MD

## 2018-02-23 ENCOUNTER — Encounter (HOSPITAL_COMMUNITY): Payer: Self-pay | Admitting: *Deleted

## 2018-02-23 ENCOUNTER — Other Ambulatory Visit: Payer: Self-pay

## 2018-02-23 ENCOUNTER — Inpatient Hospital Stay (HOSPITAL_COMMUNITY)
Admission: AD | Admit: 2018-02-23 | Discharge: 2018-02-23 | Disposition: A | Discharge status: Home / Self Care | Payer: BLUE CROSS/BLUE SHIELD | Source: Ambulatory Visit | Attending: Obstetrics and Gynecology | Admitting: Obstetrics and Gynecology

## 2018-02-23 DIAGNOSIS — O99013 Anemia complicating pregnancy, third trimester: Secondary | ICD-10-CM | POA: Diagnosis not present

## 2018-02-23 DIAGNOSIS — D649 Anemia, unspecified: Secondary | ICD-10-CM | POA: Insufficient documentation

## 2018-02-23 DIAGNOSIS — Z3A31 31 weeks gestation of pregnancy: Secondary | ICD-10-CM

## 2018-02-23 DIAGNOSIS — N898 Other specified noninflammatory disorders of vagina: Secondary | ICD-10-CM | POA: Diagnosis not present

## 2018-02-23 DIAGNOSIS — Z3689 Encounter for other specified antenatal screening: Secondary | ICD-10-CM

## 2018-02-23 DIAGNOSIS — O99019 Anemia complicating pregnancy, unspecified trimester: Secondary | ICD-10-CM

## 2018-02-23 DIAGNOSIS — O26893 Other specified pregnancy related conditions, third trimester: Secondary | ICD-10-CM | POA: Diagnosis present

## 2018-02-23 HISTORY — DX: Other specified health status: Z78.9

## 2018-02-23 LAB — URINALYSIS, ROUTINE W REFLEX MICROSCOPIC
Bilirubin Urine: NEGATIVE
GLUCOSE, UA: NEGATIVE mg/dL
Hgb urine dipstick: NEGATIVE
KETONES UR: NEGATIVE mg/dL
LEUKOCYTES UA: NEGATIVE
NITRITE: NEGATIVE
PH: 5 (ref 5.0–8.0)
Protein, ur: NEGATIVE mg/dL
SPECIFIC GRAVITY, URINE: 1.021 (ref 1.005–1.030)

## 2018-02-23 LAB — POCT FERN TEST: POCT Fern Test: NEGATIVE

## 2018-02-23 LAB — AMNISURE RUPTURE OF MEMBRANE (ROM) NOT AT ARMC: Amnisure ROM: NEGATIVE

## 2018-02-23 NOTE — MAU Note (Signed)
Pt presents with LOF, clear, since 1530 this afternoon.  Denies VB.  Reports +FM.

## 2018-02-23 NOTE — MAU Provider Note (Signed)
History   [id number]   Chief Complaint  Patient presents with  . Rupture of Membranes    HPI Alicia Rich is a 22 y.o. female  G1P0000 @31 .6 wks here with report of gush of fluid that soaked her underwear around 1530.  Leaking of fluid has continued but very small amt. Pt reports rare contractions. She denies vaginal bleeding. Last intercourse was last night. She reports good fetal movement. All other systems negative.    Patient's last menstrual period was 07/15/2017.  OB History  Gravida Para Term Preterm AB Living  1 0 0 0 0 0  SAB TAB Ectopic Multiple Live Births  0 0 0 0 0    # Outcome Date GA Lbr Len/2nd Weight Sex Delivery Anes PTL Lv  1 Current             Past Medical History:  Diagnosis Date  . Medical history non-contributory     Family History  Problem Relation Age of Onset  . Heart attack Maternal Grandfather   . Heart disease Maternal Grandfather   . Heart disease Paternal Grandfather   . Stroke Paternal Grandfather     Social History   Socioeconomic History  . Marital status: Married    Spouse name: Not on file  . Number of children: Not on file  . Years of education: Not on file  . Highest education level: Not on file  Occupational History  . Not on file  Social Needs  . Financial resource strain: Not on file  . Food insecurity:    Worry: Not on file    Inability: Not on file  . Transportation needs:    Medical: Not on file    Non-medical: Not on file  Tobacco Use  . Smoking status: Never Smoker  . Smokeless tobacco: Never Used  Substance and Sexual Activity  . Alcohol use: No  . Drug use: No  . Sexual activity: Yes  Lifestyle  . Physical activity:    Days per week: Not on file    Minutes per session: Not on file  . Stress: Not on file  Relationships  . Social connections:    Talks on phone: Not on file    Gets together: Not on file    Attends religious service: Not on file    Active member of club or organization: Not on  file    Attends meetings of clubs or organizations: Not on file    Relationship status: Not on file  Other Topics Concern  . Not on file  Social History Narrative   Married.   No children.   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   She aspires to take classes for nursing or cosmetology.     No Known Allergies  No current facility-administered medications on file prior to encounter.    Current Outpatient Medications on File Prior to Encounter  Medication Sig Dispense Refill  . ferrous sulfate 325 (65 FE) MG tablet Take 1 tablet (325 mg total) by mouth daily with breakfast. 30 tablet 3  . FIBER ADULT GUMMIES 2 g CHEW Chew by mouth.    . Prenatal Vit-Fe Fumarate-FA (PRENATAL MULTIVITAMIN) TABS tablet Take 1 tablet by mouth daily at 12 noon.       Review of Systems  Gastrointestinal: Negative for abdominal pain.  Genitourinary: Positive for vaginal discharge. Negative for vaginal bleeding.     Physical Exam   Vitals:   02/23/18 1731 02/23/18 1853  BP: 107/67 114/63  Pulse:  91 85  Resp: 20   Temp: 98.3 F (36.8 C)   SpO2: 100%   Weight: 58.3 kg   Height: 5\' 1"  (1.549 m)     Physical Exam  Constitutional: She is oriented to person, place, and time. She appears well-developed and well-nourished. No distress.  HENT:  Head: Normocephalic and atraumatic.  Neck: Normal range of motion.  Cardiovascular: Normal rate.  Respiratory: Effort normal. No respiratory distress.  GI: Soft. She exhibits no distension. There is no tenderness.  gravid  Genitourinary:  Genitourinary Comments: SSE: no pool, fern neg SVE: closed/thick  Musculoskeletal: Normal range of motion.  Neurological: She is alert and oriented to person, place, and time.  Skin: Skin is warm and dry.  Psychiatric: She has a normal mood and affect.  EFM: 150 bpm, mod variability, + accels, no decels Toco: irritability, rare ctx  Results for orders placed or performed during the hospital encounter of 02/23/18 (from the  past 24 hour(s))  Urinalysis, Routine w reflex microscopic     Status: None   Collection Time: 02/23/18  5:47 PM  Result Value Ref Range   Color, Urine YELLOW YELLOW   APPearance CLEAR CLEAR   Specific Gravity, Urine 1.021 1.005 - 1.030   pH 5.0 5.0 - 8.0   Glucose, UA NEGATIVE NEGATIVE mg/dL   Hgb urine dipstick NEGATIVE NEGATIVE   Bilirubin Urine NEGATIVE NEGATIVE   Ketones, ur NEGATIVE NEGATIVE mg/dL   Protein, ur NEGATIVE NEGATIVE mg/dL   Nitrite NEGATIVE NEGATIVE   Leukocytes, UA NEGATIVE NEGATIVE  [facility] (rom[facility]     Status: None   Collection Time: 02/23/18  6:47 PM  Result Value Ref Range   Amnisure ROM NEGATIVE   Fern Test     Status: None   Collection Time: 02/23/18  7:04 PM  Result Value Ref Range   POCT Fern Test Negative = intact amniotic membranes    MAU Course  Procedures  MDM Labs ordered and reviewed. No evidence of SROM or PTL. Stable for discharge home.  Assessment and Plan   1. [redacted] weeks gestation of pregnancy   2. Anemia affecting pregnancy, antepartum   3. NST (non-stress test) reactive   4. Vaginal discharge during pregnancy in third trimester    Discharge home Follow up in OB office as scheduled PTL precautions  Allergies as of 02/23/2018   No Known Allergies     Medication List    TAKE these medications   ferrous sulfate 325 (65 FE) MG tablet Take 1 tablet (325 mg total) by mouth daily with breakfast.   FIBER ADULT GUMMIES 2 g Chew Chew by mouth.   prenatal multivitamin Tabs tablet Take 1 tablet by mouth daily at 12 noon.      Donette LarryBhambri, Price Lachapelle, CNM 02/23/2018 7:46 PM

## 2018-02-23 NOTE — Discharge Instructions (Signed)
Braxton Hicks Contractions °Contractions of the uterus can occur throughout pregnancy, but they are not always a sign that you are in labor. You may have practice contractions called Braxton Hicks contractions. These false labor contractions are sometimes confused with true labor. °What are Braxton Hicks contractions? °Braxton Hicks contractions are tightening movements that occur in the muscles of the uterus before labor. Unlike true labor contractions, these contractions do not result in opening (dilation) and thinning of the cervix. Toward the end of pregnancy (32-34 weeks), Braxton Hicks contractions can happen more often and may become stronger. These contractions are sometimes difficult to tell apart from true labor because they can be very uncomfortable. You should not feel embarrassed if you go to the hospital with false labor. °Sometimes, the only way to tell if you are in true labor is for your health care provider to look for changes in the cervix. The health care provider will do a physical exam and may monitor your contractions. If you are not in true labor, the exam should show that your cervix is not dilating and your water has not broken. °If there are other health problems associated with your pregnancy, it is completely safe for you to be sent home with false labor. You may continue to have Braxton Hicks contractions until you go into true labor. °How to tell the difference between true labor and false labor °True labor °· Contractions last 30-70 seconds. °· Contractions become very regular. °· Discomfort is usually felt in the top of the uterus, and it spreads to the lower abdomen and low back. °· Contractions do not go away with walking. °· Contractions usually become more intense and increase in frequency. °· The cervix dilates and gets thinner. °False labor °· Contractions are usually shorter and not as strong as true labor contractions. °· Contractions are usually irregular. °· Contractions  are often felt in the front of the lower abdomen and in the groin. °· Contractions may go away when you walk around or change positions while lying down. °· Contractions get weaker and are shorter-lasting as time goes on. °· The cervix usually does not dilate or become thin. °Follow these instructions at home: °· Take over-the-counter and prescription medicines only as told by your health care provider. °· Keep up with your usual exercises and follow other instructions from your health care provider. °· Eat and drink lightly if you think you are going into labor. °· If Braxton Hicks contractions are making you uncomfortable: °? Change your position from lying down or resting to walking, or change from walking to resting. °? Sit and rest in a tub of warm water. °? Drink enough fluid to keep your urine pale yellow. Dehydration may cause these contractions. °? Do slow and deep breathing several times an hour. °· Keep all follow-up prenatal visits as told by your health care provider. This is important. °Contact a health care provider if: °· You have a fever. °· You have continuous pain in your abdomen. °Get help right away if: °· Your contractions become stronger, more regular, and closer together. °· You have fluid leaking or gushing from your vagina. °· You pass blood-tinged mucus (bloody show). °· You have bleeding from your vagina. °· You have low back pain that you never had before. °· You feel your baby’s head pushing down and causing pelvic pressure. °· Your baby is not moving inside you as much as it used to. °Summary °· Contractions that occur before labor are called Braxton   Hicks contractions, false labor, or practice contractions. °· Braxton Hicks contractions are usually shorter, weaker, farther apart, and less regular than true labor contractions. True labor contractions usually become progressively stronger and regular and they become more frequent. °· Manage discomfort from Braxton Hicks contractions by  changing position, resting in a warm bath, drinking plenty of water, or practicing deep breathing. °This information is not intended to replace advice given to you by your health care provider. Make sure you discuss any questions you have with your health care provider. °Document Released: 11/10/2016 Document Revised: 11/10/2016 Document Reviewed: 11/10/2016 °Elsevier Interactive Patient Education © 2018 [employer] ° °

## 2018-03-07 ENCOUNTER — Ambulatory Visit (INDEPENDENT_AMBULATORY_CARE_PROVIDER_SITE_OTHER): Payer: BLUE CROSS/BLUE SHIELD | Admitting: Family Medicine

## 2018-03-07 ENCOUNTER — Encounter: Payer: Self-pay | Admitting: Family Medicine

## 2018-03-07 VITALS — BP 109/75 | HR 90 | Wt 131.6 lb

## 2018-03-07 DIAGNOSIS — O219 Vomiting of pregnancy, unspecified: Secondary | ICD-10-CM

## 2018-03-07 DIAGNOSIS — O99019 Anemia complicating pregnancy, unspecified trimester: Secondary | ICD-10-CM

## 2018-03-07 DIAGNOSIS — Z3400 Encounter for supervision of normal first pregnancy, unspecified trimester: Secondary | ICD-10-CM

## 2018-03-07 LAB — CBC
HEMOGLOBIN: 11.3 g/dL (ref 11.1–15.9)
Hematocrit: 34.1 % (ref 34.0–46.6)
MCH: 30.4 pg (ref 26.6–33.0)
MCHC: 33.1 g/dL (ref 31.5–35.7)
MCV: 92 fL (ref 79–97)
PLATELETS: 295 10*3/uL (ref 150–450)
RBC: 3.72 x10E6/uL — AB (ref 3.77–5.28)
RDW: 12 % — ABNORMAL LOW (ref 12.3–15.4)
WBC: 9.3 10*3/uL (ref 3.4–10.8)

## 2018-03-07 MED ORDER — FERROUS SULFATE 325 (65 FE) MG PO TABS: 325.0000 mg | ORAL_TABLET | Freq: Every day | ORAL | 3 refills | Status: DC

## 2018-03-07 NOTE — Progress Notes (Signed)
   PRENATAL VISIT NOTE  Subjective:  Alicia Rich is a [age] y.o. G1P0000 at 1937w4d being seen today for ongoing prenatal care.  She is currently monitored for the following issues for this low-risk pregnancy and has Supervision of normal first pregnancy, antepartum; Nausea and vomiting during pregnancy prior to [redacted] weeks gestation; and Anemia affecting pregnancy, antepartum on their problem list.  Patient reports no complaints.  Contractions: Irregular. Vag. Bleeding: None.  Movement: Present. Denies leaking of fluid.   The following portions of the patient's history were reviewed and updated as appropriate: allergies, current medications, past family history, past medical history, past social history, past surgical history and problem list. Problem list updated.  Objective:   Vitals:   03/07/18 1048  BP: 109/75  Pulse: 90  Weight: 131 lb 9.6 oz (59.7 kg)    Fetal Status: Fetal Heart Rate (bpm): 138 Fundal Height: 31 cm Movement: Present     General:  Alert, oriented and cooperative. Patient is in no acute distress.  Skin: Skin is warm and dry. No rash noted.   Cardiovascular: Normal heart rate noted  Respiratory: Normal respiratory effort, no problems with respiration noted  Abdomen: Soft, gravid, appropriate for gestational age.  Pain/Pressure: Absent     Pelvic: Cervical exam deferred        Extremities: Normal range of motion.  Edema: None  Mental Status: Normal mood and affect. Normal behavior. Normal judgment and thought content.   Assessment and Plan:  Pregnancy: G1P0000 at 2737w4d  1. Anemia affecting pregnancy, antepartum - CBC - ferrous sulfate 325 (65 FE) MG tablet; Take 1 tablet (325 mg total) by mouth daily with breakfast.  Dispense: 30 tablet; Refill: 3  2. Nausea and vomiting during pregnancy prior to [redacted] weeks gestation resolved  3. Supervision of normal first pregnancy, antepartum Up to date   Preterm labor symptoms and general obstetric precautions including  but not limited to vaginal bleeding, contractions, leaking of fluid and fetal movement were reviewed in detail with the patient. Please refer to After Visit Summary for other counseling recommendations.  Return in about 2 weeks (around 03/21/2018) for Routine prenatal care.  No future appointments.  Federico FlakeKimberly Niles Lyman Balingit, MD

## 2018-03-21 ENCOUNTER — Ambulatory Visit (INDEPENDENT_AMBULATORY_CARE_PROVIDER_SITE_OTHER): Payer: BLUE CROSS/BLUE SHIELD | Admitting: Family Medicine

## 2018-03-21 VITALS — BP 104/67 | HR 90 | Wt 134.0 lb

## 2018-03-21 DIAGNOSIS — O99019 Anemia complicating pregnancy, unspecified trimester: Secondary | ICD-10-CM

## 2018-03-21 DIAGNOSIS — O99013 Anemia complicating pregnancy, third trimester: Secondary | ICD-10-CM

## 2018-03-21 DIAGNOSIS — Z3403 Encounter for supervision of normal first pregnancy, third trimester: Secondary | ICD-10-CM

## 2018-03-21 DIAGNOSIS — Z3400 Encounter for supervision of normal first pregnancy, unspecified trimester: Secondary | ICD-10-CM

## 2018-03-21 DIAGNOSIS — O219 Vomiting of pregnancy, unspecified: Secondary | ICD-10-CM

## 2018-03-21 NOTE — Progress Notes (Signed)
   PRENATAL VISIT NOTE  Subjective:  Alicia Rich is a 22 y.o. G1P0000 at [redacted]w[redacted]d being seen today for ongoing prenatal care.  She is currently monitored for the following issues for this low-risk pregnancy and has Supervision of normal first pregnancy, antepartum; Nausea and vomiting during pregnancy prior to [redacted] weeks gestation; and Anemia affecting pregnancy, antepartum on their problem list.  Patient reports no complaints.  Contractions: Not present.  .  Movement: Present. Denies leaking of fluid.   The following portions of the patient's history were reviewed and updated as appropriate: allergies, current medications, past family history, past medical history, past social history, past surgical history and problem list. Problem list updated.  Objective:   Vitals:   03/21/18 1100  BP: 104/67  Pulse: 90  Weight: 134 lb (60.8 kg)    Fetal Status: Fetal Heart Rate (bpm): 143   Movement: Present     General:  Alert, oriented and cooperative. Patient is in no acute distress.  Skin: Skin is warm and dry. No rash noted.   Cardiovascular: Normal heart rate noted  Respiratory: Normal respiratory effort, no problems with respiration noted  Abdomen: Soft, gravid, appropriate for gestational age.  Pain/Pressure: Absent     Pelvic: Cervical exam deferred        Extremities: Normal range of motion.  Edema: None  Mental Status: Normal mood and affect. Normal behavior. Normal judgment and thought content.   Assessment and Plan:  Pregnancy: G1P0000 at [redacted]w[redacted]d  1. Supervision of normal first pregnancy, antepartum Doing well, taking childbirth ed classes  2. Anemia affecting pregnancy, antepartum CBC approved.   3. Nausea and vomiting during pregnancy prior to [redacted] weeks gestation resolved  Preterm labor symptoms and general obstetric precautions including but not limited to vaginal bleeding, contractions, leaking of fluid and fetal movement were reviewed in detail with the patient. Please  refer to After Visit Summary for other counseling recommendations.  Return in about 1 week (around 03/28/2018) for Routine prenatal care, GBS swab .  Future Appointments  Date Time Provider Department Center  03/28/2018  2:15 PM Armando Reichert, CNM [facility]    Federico Flake, MD

## 2018-03-28 ENCOUNTER — Encounter: Payer: Self-pay | Admitting: Advanced Practice Midwife

## 2018-03-28 ENCOUNTER — Other Ambulatory Visit (HOSPITAL_COMMUNITY)
Admission: RE | Admit: 2018-03-28 | Discharge: 2018-03-28 | Disposition: A | Discharge status: Home / Self Care | Payer: BLUE CROSS/BLUE SHIELD | Source: Ambulatory Visit | Attending: Advanced Practice Midwife | Admitting: Advanced Practice Midwife

## 2018-03-28 ENCOUNTER — Ambulatory Visit (INDEPENDENT_AMBULATORY_CARE_PROVIDER_SITE_OTHER): Payer: BLUE CROSS/BLUE SHIELD | Admitting: Advanced Practice Midwife

## 2018-03-28 VITALS — BP 109/72 | HR 90 | Wt 135.2 lb

## 2018-03-28 DIAGNOSIS — Z3403 Encounter for supervision of normal first pregnancy, third trimester: Secondary | ICD-10-CM | POA: Diagnosis present

## 2018-03-28 DIAGNOSIS — Z3400 Encounter for supervision of normal first pregnancy, unspecified trimester: Secondary | ICD-10-CM

## 2018-03-28 DIAGNOSIS — Z3A36 36 weeks gestation of pregnancy: Secondary | ICD-10-CM | POA: Insufficient documentation

## 2018-03-28 NOTE — Patient Instructions (Signed)
Vaginal delivery means that you will give birth by pushing your baby out of your birth canal (vagina). A team of health care providers will help you before, during, and after vaginal delivery. Birth experiences are unique for every woman and every pregnancy, and birth experiences vary depending on where you choose to give birth. What should I do to prepare for my baby's birth? Before your baby is born, it is important to talk with your health care provider about:  Your labor and delivery preferences. These may include: ? Medicines that you may be given. ? How you will manage your pain. This might include non-medical pain relief techniques or injectable pain relief such as epidural analgesia. ? How you and your baby will be monitored during labor and delivery. ? Who may be in the labor and delivery room with you. ? Your feelings about surgical delivery of your baby (cesarean delivery, or C-section) if this becomes necessary. ? Your feelings about receiving donated blood through an IV tube (blood transfusion) if this becomes necessary.  Whether you are able: ? To take pictures or videos of the birth. ? To eat during labor and delivery. ? To move around, walk, or change positions during labor and delivery.  What to expect after your baby is born, such as: ? Whether delayed umbilical cord clamping and cutting is offered. ? Who will care for your baby right after birth. ? Medicines or tests that may be recommended for your baby. ? Whether breastfeeding is supported in your hospital or birth center. ? How long you will be in the hospital or birth center.  How any medical conditions you have may affect your baby or your labor and delivery experience.  To prepare for your baby's birth, you should also:  Attend all of your health care visits before delivery (prenatal visits) as recommended by your health care provider. This is important.  Prepare your home for your baby's arrival. Make sure  that you have: ? Diapers. ? Baby clothing. ? Feeding equipment. ? Safe sleeping arrangements for you and your baby.  Install a car seat in your vehicle. Have your car seat checked by a certified car seat installer to make sure that it is installed safely.  Think about who will help you with your new baby at home for at least the first several weeks after delivery.  What can I expect when I arrive at the birth center or hospital? Once you are in labor and have been admitted into the hospital or birth center, your health care provider may:  Review your pregnancy history and any concerns you have.  Insert an IV tube into one of your veins. This is used to give you fluids and medicines.  Check your blood pressure, pulse, temperature, and heart rate (vital signs).  Check whether your bag of water (amniotic sac) has broken (ruptured).  Talk with you about your birth plan and discuss pain control options.  Monitoring Your health care provider may monitor your contractions (uterine monitoring) and your baby's heart rate (fetal monitoring). You may need to be monitored:  Often, but not continuously (intermittently).  All the time or for long periods at a time (continuously). Continuous monitoring may be needed if: ? You are taking certain medicines, such as medicine to relieve pain or make your contractions stronger. ? You have pregnancy or labor complications.  Monitoring may be done by:  Placing a special stethoscope or a handheld monitoring device on your abdomen to check your   baby's heartbeat, and feeling your abdomen for contractions. This method of monitoring does not continuously record your baby's heartbeat or your contractions.  Placing monitors on your abdomen (external monitors) to record your baby's heartbeat and the frequency and length of contractions. You may not have to wear external monitors all the time.  Placing monitors inside of your uterus (internal monitors) to  record your baby's heartbeat and the frequency, length, and strength of your contractions. ? Your health care provider may use internal monitors if he or she needs more information about the strength of your contractions or your baby's heart rate. ? Internal monitors are put in place by passing a thin, flexible wire through your vagina and into your uterus. Depending on the type of monitor, it may remain in your uterus or on your baby's head until birth. ? Your health care provider will discuss the benefits and risks of internal monitoring with you and will ask for your permission before inserting the monitors.  Telemetry. This is a type of continuous monitoring that can be done with external or internal monitors. Instead of having to stay in bed, you are able to move around during telemetry. Ask your health care provider if telemetry is an option for you.  Physical exam Your health care provider may perform a physical exam. This may include:  Checking whether your baby is positioned: ? With the head toward your vagina (head-down). This is most common. ? With the head toward the top of your uterus (head-up or breech). If your baby is in a breech position, your health care provider may try to turn your baby to a head-down position so you can deliver vaginally. If it does not seem that your baby can be born vaginally, your provider may recommend surgery to deliver your baby. In rare cases, you may be able to deliver vaginally if your baby is head-up (breech delivery). ? Lying sideways (transverse). Babies that are lying sideways cannot be delivered vaginally.  Checking your cervix to determine: ? Whether it is thinning out (effacing). ? Whether it is opening up (dilating). ? How low your baby has moved into your birth canal.  What are the three stages of labor and delivery?  Normal labor and delivery is divided into the following three stages: Stage 1  Stage 1 is the longest stage of labor,  and it can last for hours or days. Stage 1 includes: ? Early labor. This is when contractions may be irregular, or regular and mild. Generally, early labor contractions are more than 10 minutes apart. ? Active labor. This is when contractions get longer, more regular, more frequent, and more intense. ? The transition phase. This is when contractions happen very close together, are very intense, and may last longer than during any other part of labor.  Contractions generally feel mild, infrequent, and irregular at first. They get stronger, more frequent (about every 2-3 minutes), and more regular as you progress from early labor through active labor and transition.  Many women progress through stage 1 naturally, but you may need help to continue making progress. If this happens, your health care provider may talk with you about: ? Rupturing your amniotic sac if it has not ruptured yet. ? Giving you medicine to help make your contractions stronger and more frequent.  Stage 1 ends when your cervix is completely dilated to 4 inches (10 cm) and completely effaced. This happens at the end of the transition phase. Stage 2  Once your cervix   is completely effaced and dilated to 4 inches (10 cm), you may start to feel an urge to push. It is common for the body to naturally take a rest before feeling the urge to push, especially if you received an epidural or certain other pain medicines. This rest period may last for up to 1-2 hours, depending on your unique labor experience.  During stage 2, contractions are generally less painful, because pushing helps relieve contraction pain. Instead of contraction pain, you may feel stretching and burning pain, especially when the widest part of your baby's head passes through the vaginal opening (crowning).  Your health care provider will closely monitor your pushing progress and your baby's progress through the vagina during stage 2.  Your health care provider may  massage the area of skin between your vaginal opening and anus (perineum) or apply warm compresses to your perineum. This helps it stretch as the baby's head starts to crown, which can help prevent perineal tearing. ? In some cases, an incision may be made in your perineum (episiotomy) to allow the baby to pass through the vaginal opening. An episiotomy helps to make the opening of the vagina larger to allow more room for the baby to fit through.  It is very important to breathe and focus so your health care provider can control the delivery of your baby's head. Your health care provider may have you decrease the intensity of your pushing, to help prevent perineal tearing.  After delivery of your baby's head, the shoulders and the rest of the body generally deliver very quickly and without difficulty.  Once your baby is delivered, the umbilical cord may be cut right away, or this may be delayed for 1-2 minutes, depending on your baby's health. This may vary among health care providers, hospitals, and birth centers.  If you and your baby are healthy enough, your baby may be placed on your chest or abdomen to help maintain the baby's temperature and to help you bond with each other. Some mothers and babies start breastfeeding at this time. Your health care team will dry your baby and help keep your baby warm during this time.  Your baby may need immediate care if he or she: ? Showed signs of distress during labor. ? Has a medical condition. ? Was born too early (prematurely). ? Had a bowel movement before birth (meconium). ? Shows signs of difficulty transitioning from being inside the uterus to being outside of the uterus. If you are planning to breastfeed, your health care team will help you begin a feeding. Stage 3  The third stage of labor starts immediately after the birth of your baby and ends after you deliver the placenta. The placenta is an organ that develops during pregnancy to provide  oxygen and nutrients to your baby in the womb.  Delivering the placenta may require some pushing, and you may have mild contractions. Breastfeeding can stimulate contractions to help you deliver the placenta.  After the placenta is delivered, your uterus should tighten (contract) and become firm. This helps to stop bleeding in your uterus. To help your uterus contract and to control bleeding, your health care provider may: ? Give you medicine by injection, through an IV tube, by mouth, or through your rectum (rectally). ? Massage your abdomen or perform a vaginal exam to remove any blood clots that are left in your uterus. ? Empty your bladder by placing a thin, flexible tube (catheter) into your bladder. ? Encourage you to   breastfeed your baby. After labor is over, you and your baby will be monitored closely to ensure that you are both healthy until you are ready to go home. Your health care team will teach you how to care for yourself and your baby. This information is not intended to replace advice given to you by your health care provider. Make sure you discuss any questions you have with your health care provider. Document Released: 04/05/2008 Document Revised: 01/15/2016 Document Reviewed: 07/12/2015 Elsevier Interactive Patient Education  2018 [employer]  

## 2018-03-28 NOTE — Progress Notes (Signed)
   PRENATAL VISIT NOTE  Subjective:  Alicia Rich is a 22 y.o. G1P0000 at 74100w4d being seen today for ongoing prenatal care.  She is currently monitored for the following issues for this low-risk pregnancy and has Supervision of normal first pregnancy, antepartum; Nausea and vomiting during pregnancy prior to [redacted] weeks gestation; and Anemia affecting pregnancy, antepartum on their problem list.  Patient reports no complaints.  Contractions: Not present.  .  Movement: Present. Denies leaking of fluid.   The following portions of the patient's history were reviewed and updated as appropriate: allergies, current medications, past family history, past medical history, past social history, past surgical history and problem list. Problem list updated.  Objective:   Vitals:   03/28/18 1420  BP: 109/72  Pulse: 90  Weight: 135 lb 3.2 oz (61.3 kg)    Fetal Status: Fetal Heart Rate (bpm): 137 Fundal Height: 36 cm Movement: Present  Presentation: Complete Breech  General:  Alert, oriented and cooperative. Patient is in no acute distress.  Skin: Skin is warm and dry. No rash noted.   Cardiovascular: Normal heart rate noted  Respiratory: Normal respiratory effort, no problems with respiration noted  Abdomen: Soft, gravid, appropriate for gestational age.  Pain/Pressure: Absent     Pelvic: Cervical exam performed Dilation: 1 Effacement (%): Thick Station: -3  Extremities: Normal range of motion.  Edema: None  Mental Status: Normal mood and affect. Normal behavior. Normal judgment and thought content.   Pt informed that the ultrasound is considered a limited OB ultrasound and is not intended to be a complete ultrasound exam.  Patient also informed that the ultrasound is not being completed with the intent of assessing for fetal or placental anomalies or any pelvic abnormalities.  Explained that the purpose of today's ultrasound is to assess for  presentation.  Patient acknowledges the purpose of the  exam and the limitations of the study.    BREECH  Assessment and Plan:  Pregnancy: G1P0000 at 26100w4d  1. Supervision of normal first pregnancy, antepartum - Routine care - Culture, beta strep (group b only) - [facility]Chlam[facility] - Fetus is breech today, patient given exercises at spinning babies to start now, and will FU at next visit to discuss options.   Preterm labor symptoms and general obstetric precautions including but not limited to vaginal bleeding, contractions, leaking of fluid and fetal movement were reviewed in detail with the patient. Please refer to After Visit Summary for other counseling recommendations.  Return in about 1 week (around 04/04/2018).  Future Appointments  Date Time Provider Department Center  04/04/2018  4:30 PM Federico FlakeNewton, Kimberly Niles, MD [facility]    Thressa ShellerHeather Hogan, CNM

## 2018-03-29 LAB — GC/CHLAMYDIA PROBE AMP (~~LOC~~) NOT AT ARMC
CHLAMYDIA, DNA PROBE: NEGATIVE
NEISSERIA GONORRHEA: NEGATIVE

## 2018-03-31 LAB — CULTURE, BETA STREP (GROUP B ONLY): STREP GP B CULTURE: NEGATIVE

## 2018-04-04 ENCOUNTER — Ambulatory Visit (INDEPENDENT_AMBULATORY_CARE_PROVIDER_SITE_OTHER): Payer: BLUE CROSS/BLUE SHIELD | Admitting: Family Medicine

## 2018-04-04 VITALS — BP 108/70 | HR 98 | Wt 136.0 lb

## 2018-04-04 DIAGNOSIS — O219 Vomiting of pregnancy, unspecified: Secondary | ICD-10-CM

## 2018-04-04 DIAGNOSIS — O99013 Anemia complicating pregnancy, third trimester: Secondary | ICD-10-CM

## 2018-04-04 DIAGNOSIS — Z3403 Encounter for supervision of normal first pregnancy, third trimester: Secondary | ICD-10-CM

## 2018-04-04 DIAGNOSIS — O99019 Anemia complicating pregnancy, unspecified trimester: Secondary | ICD-10-CM

## 2018-04-04 DIAGNOSIS — Z3400 Encounter for supervision of normal first pregnancy, unspecified trimester: Secondary | ICD-10-CM

## 2018-04-04 NOTE — Progress Notes (Signed)
   PRENATAL VISIT NOTE  Subjective:  Alicia Rich is a 22 y.o. G1P0000 at [redacted]w[redacted]d being seen today for ongoing prenatal care.  She is currently monitored for the following issues for this low-risk pregnancy and has Supervision of normal first pregnancy, antepartum; Nausea and vomiting during pregnancy prior to [redacted] weeks gestation; and Anemia affecting pregnancy, antepartum on their problem list.  Patient reports no complaints.  Contractions: Not present. Vag. Bleeding: None.  Movement: Present. Denies leaking of fluid.   The following portions of the patient's history were reviewed and updated as appropriate: allergies, current medications, past family history, past medical history, past social history, past surgical history and problem list. Problem list updated.  Objective:   Vitals:   04/04/18 1618  BP: 108/70  Pulse: 98  Weight: 136 lb (61.7 kg)    Fetal Status: Fetal Heart Rate (bpm): 137   Movement: Present     General:  Alert, oriented and cooperative. Patient is in no acute distress.  Skin: Skin is warm and dry. No rash noted.   Cardiovascular: Normal heart rate noted  Respiratory: Normal respiratory effort, no problems with respiration noted  Abdomen: Soft, gravid, appropriate for gestational age.  Pain/Pressure: Present     Pelvic: Cervical exam performed        Extremities: Normal range of motion.  Edema: None  Mental Status: Normal mood and affect. Normal behavior. Normal judgment and thought content.    Bedside US confirmed breech presentation   Assessment and Plan:  Pregnancy: G1P0000 at [redacted]w[redacted]d  1. Supervision of normal first pregnancy, antepartum BREECH today Discussed ECV and risk/benefits. Discussed primary CS for breech as well Reviewed spinning babies, moxibustion and chiropractic care to help convert to vtx.   2. Nausea and vomiting during pregnancy prior to [redacted] weeks gestation Resolved  3. Anemia affecting pregnancy, antepartum HGB stable and on Fe  supplement   Term labor symptoms and general obstetric precautions including but not limited to vaginal bleeding, contractions, leaking of fluid and fetal movement were reviewed in detail with the patient. Please refer to After Visit Summary for other counseling recommendations.  No follow-ups on file.  Future Appointments  Date Time Provider Department Center  04/04/2018  4:30 PM Federico Flake, MD [facility]    Federico Flake, MD

## 2018-04-05 ENCOUNTER — Telehealth (HOSPITAL_COMMUNITY): Payer: Self-pay | Admitting: *Deleted

## 2018-04-05 NOTE — Telephone Encounter (Signed)
Preadmission screen  

## 2018-04-06 ENCOUNTER — Encounter (HOSPITAL_COMMUNITY): Payer: Self-pay

## 2018-04-06 ENCOUNTER — Observation Stay (HOSPITAL_COMMUNITY)
Admission: RE | Admit: 2018-04-06 | Discharge: 2018-04-06 | Disposition: A | Discharge status: Home / Self Care | Payer: BLUE CROSS/BLUE SHIELD | Source: Ambulatory Visit | Attending: Obstetrics & Gynecology | Admitting: Obstetrics & Gynecology

## 2018-04-06 VITALS — BP 112/67 | HR 80 | Temp 98.2°F | Resp 18 | Ht 60.0 in | Wt 137.0 lb

## 2018-04-06 DIAGNOSIS — O321XX0 Maternal care for breech presentation, not applicable or unspecified: Principal | ICD-10-CM

## 2018-04-06 DIAGNOSIS — O99019 Anemia complicating pregnancy, unspecified trimester: Secondary | ICD-10-CM

## 2018-04-06 DIAGNOSIS — Z3A37 37 weeks gestation of pregnancy: Secondary | ICD-10-CM | POA: Insufficient documentation

## 2018-04-06 DIAGNOSIS — Z3400 Encounter for supervision of normal first pregnancy, unspecified trimester: Secondary | ICD-10-CM

## 2018-04-06 MED ORDER — LACTATED RINGERS IV SOLN
INTRAVENOUS | Status: DC
  Administered 2018-04-06: 08:00:00 via INTRAVENOUS

## 2018-04-06 MED ORDER — TERBUTALINE SULFATE 1 MG/ML IJ SOLN
0.2500 mg | Freq: Once | INTRAMUSCULAR | Status: AC
  Administered 2018-04-06: 0.25 mg via SUBCUTANEOUS
  Filled 2018-04-06: qty 1

## 2018-04-06 NOTE — H&P (Signed)
Alicia Rich is a 22 y.o. female presenting for ECV, breech presentation at office visit 2 days ago. OB History    Gravida  1   Para  0   Term  0   Preterm  0   AB  0   Living  0     SAB  0   TAB  0   Ectopic  0   Multiple  0   Live Births  0          Past Medical History:  Diagnosis Date  . Medical history non-contributory    Past Surgical History:  Procedure Laterality Date  . NO PAST SURGERIES     Family History: family history includes Heart attack in her maternal grandfather; Heart disease in her maternal grandfather and paternal grandfather; Stroke in her paternal grandfather. Social History:  reports that she has never smoked. She has never used smokeless tobacco. She reports that she does not drink alcohol or use drugs.     Maternal Diabetes: No Genetic Screening: Normal Maternal Ultrasounds/Referrals: Normal Fetal Ultrasounds or other Referrals:  None Maternal Substance Abuse:  No Significant Maternal Medications:  None Significant Maternal Lab Results:  None Other Comments:  breech  Review of Systems  Constitutional: Negative.   Cardiovascular: Negative.   Gastrointestinal: Negative.   Genitourinary: Negative.    Maternal Medical History:  Reason for admission: Elective ECV  Contractions: Frequency: rare.    Fetal activity: Perceived fetal activity is normal.    Prenatal complications: no prenatal complications Prenatal Complications - Diabetes: none.      Blood pressure 116/67, pulse 82, temperature 98.2 F (36.8 C), temperature source Oral, resp. rate 18, height 5' (1.524 m), weight 62.1 kg, last menstrual period 07/15/2017. Maternal Exam:  Uterine Assessment: Contraction frequency is rare.   Abdomen: Patient reports no abdominal tenderness. Fetal presentation: breech  Introitus: not evaluated.     Physical Exam  Vitals reviewed. Constitutional: She is oriented to person, place, and time. No distress.  Cardiovascular:  Normal rate.  GI:  Gravid, US shows subjectively nl AF, breech head RUQ  Neurological: She is alert and oriented to person, place, and time.  Skin: Skin is warm and dry.  Psychiatric: She has a normal mood and affect. Her behavior is normal.    Prenatal labs: ABO, Rh: O/Positive/-- (03/13 1105) Antibody: Negative (03/13 1105) Rubella: 1.55 (03/13 1105) RPR: Non Reactive (07/24 0847)  HBsAg: Negative (03/13 1105)  HIV: Non Reactive (07/24 0847)  GBS:     Assessment/Plan: Breech presentation for ECV.  See procedure note   Scheryl Darter 04/06/2018, 9:33 AM

## 2018-04-06 NOTE — Progress Notes (Signed)
External Cephalic Version  Preprocedure Diagnosis:  22 y.o. G1P0 at [redacted]w[redacted]d  weeks gestational age with breech presentation  Post-procedure Diagnosis: same  Procedure:  Attempted external version unsuccessful  Procedure in detail:   The patient was brought to Labor and Delivery where a reactive fetal heart tracing was obtained. The patient was noted to have irregular contractions. She was given 1 dose of subcutaneous terbutaline which resolved her contraction. A bedside ultrasound was performed which revealed single intrauterine pregnancy and breech presentation. There was noted to be adequate fluid. Using manual pressure, the breech was manipulated in a forward roll fashion with several attempts but version was not obtained. Fetal heart tones were checked intermittently during the procedure and were noted to be reassuring. Following unsuccessful external cephalic version, the patient was placed on continuous external fetal monitoring. She was noted to have a reassuring and reactive tracing for 1 hour following the external cephalic version. She did not have regular contractions and therefore she was felt to be stable for discharge to home. She was given appropriate labor instructions. Patient ID: Alicia Rich, female   DOB: 24-Jul-1995, 22 y.o.   MRN: [id number]

## 2018-04-09 ENCOUNTER — Telehealth (HOSPITAL_COMMUNITY): Payer: Self-pay | Admitting: *Deleted

## 2018-04-09 NOTE — Telephone Encounter (Signed)
Preadmission screen  

## 2018-04-10 ENCOUNTER — Encounter (HOSPITAL_COMMUNITY): Payer: Self-pay

## 2018-04-11 ENCOUNTER — Ambulatory Visit (INDEPENDENT_AMBULATORY_CARE_PROVIDER_SITE_OTHER): Payer: BLUE CROSS/BLUE SHIELD | Admitting: Advanced Practice Midwife

## 2018-04-11 VITALS — BP 114/77 | HR 101 | Wt 137.0 lb

## 2018-04-11 DIAGNOSIS — Z3403 Encounter for supervision of normal first pregnancy, third trimester: Secondary | ICD-10-CM

## 2018-04-11 DIAGNOSIS — O321XX0 Maternal care for breech presentation, not applicable or unspecified: Secondary | ICD-10-CM

## 2018-04-11 DIAGNOSIS — Z3400 Encounter for supervision of normal first pregnancy, unspecified trimester: Secondary | ICD-10-CM

## 2018-04-11 NOTE — Progress Notes (Signed)
   PRENATAL VISIT NOTE  Subjective:  Alicia Rich is a 22 y.o. G1P0000 at [redacted]w[redacted]d being seen today for ongoing prenatal care.  She is currently monitored for the following issues for this low-risk pregnancy and has Supervision of normal first pregnancy, antepartum; Nausea and vomiting during pregnancy prior to [redacted] weeks gestation; Anemia affecting pregnancy, antepartum; and Breech presentation on their problem list.  Patient reports no complaints.  Contractions: Irregular.  .  Movement: Present. Denies leaking of fluid.   The following portions of the patient's history were reviewed and updated as appropriate: allergies, current medications, past family history, past medical history, past social history, past surgical history and problem list. Problem list updated.  Objective:   Vitals:   04/11/18 1521  BP: 114/77  Pulse: (!) 101  Weight: 137 lb (62.1 kg)    Fetal Status: Fetal Heart Rate (bpm): 144 Fundal Height: 38 cm Movement: Present  Presentation: Complete Breech  General:  Alert, oriented and cooperative. Patient is in no acute distress.  Skin: Skin is warm and dry. No rash noted.   Cardiovascular: Normal heart rate noted  Respiratory: Normal respiratory effort, no problems with respiration noted  Abdomen: Soft, gravid, appropriate for gestational age.  Pain/Pressure: Absent     Pelvic: Cervical exam deferred        Extremities: Normal range of motion.  Edema: None  Mental Status: Normal mood and affect. Normal behavior. Normal judgment and thought content.   Assessment and Plan:  Pregnancy: G1P0000 at [redacted]w[redacted]d  1. Supervision of normal first pregnancy, antepartum --No complaints or concerns today --Discussed that she should be evaluated in [facility] if she experiences painful labor contractions prior to her c/s  2. Breech presentation, single or unspecified fetus --Unsuccessful version --Cesarean section for complete breech presentation scheduled for 04/14/18  Term labor  symptoms and general obstetric precautions including but not limited to vaginal bleeding, contractions, leaking of fluid and fetal movement were reviewed in detail with the patient. Please refer to After Visit Summary for other counseling recommendations.  No follow-ups on file.  Future Appointments  Date Time Provider Department Center  04/13/2018  9:00 AM [facility] PAT 5 [facility] None    Calvert [place]or, [place]  04/11/18  3:34 PM

## 2018-04-12 NOTE — Patient Instructions (Signed)
Zahli Vetsch  04/12/2018   Your procedure is scheduled on:  04/14/2018  Enter through the Main Entrance of [facility] at 0930 AM.  Pick up the phone at the desk and dial [phone]  Call this number if you have problems the morning of surgery:[phone]  Remember:   Do not eat food:(After Midnight) Desps de medianoche.  Do not drink clear liquids: (After Midnight) Desps de medianoche.  Take these medicines the morning of surgery with A SIP OF WATER: none   Do not wear jewelry, make-up or nail polish.  Do not wear lotions, powders, or perfumes. Do not wear deodorant.  Do not shave 48 hours prior to surgery.  Do not bring valuables to the hospital.  [facility] is not   responsible for any belongings or valuables brought to the hospital.  Contacts, dentures or bridgework may not be worn into surgery.  Leave suitcase in the car. After surgery it may be brought to your room.  For patients admitted to the hospital, checkout time is 11:00 AM the day of              discharge.    N/A   Please read over the following fact sheets that you were given:   Surgical Site Infection Prevention

## 2018-04-13 ENCOUNTER — Encounter (HOSPITAL_COMMUNITY)
Admission: RE | Admit: 2018-04-13 | Discharge: 2018-04-13 | Disposition: A | Discharge status: Home / Self Care | Payer: BLUE CROSS/BLUE SHIELD | Source: Ambulatory Visit | Attending: Obstetrics & Gynecology | Admitting: Obstetrics & Gynecology

## 2018-04-13 DIAGNOSIS — Z01812 Encounter for preprocedural laboratory examination: Secondary | ICD-10-CM | POA: Insufficient documentation

## 2018-04-13 LAB — CBC
HCT: 36.9 % (ref 36.0–46.0)
HEMOGLOBIN: 12.4 g/dL (ref 12.0–15.0)
MCH: 30.5 pg (ref 26.0–34.0)
MCHC: 33.6 g/dL (ref 30.0–36.0)
MCV: 90.9 fL (ref 78.0–100.0)
Platelets: 254 10*3/uL (ref 150–400)
RBC: 4.06 MIL/uL (ref 3.87–5.11)
RDW: 12.6 % (ref 11.5–15.5)
WBC: 9.1 10*3/uL (ref 4.0–10.5)

## 2018-04-13 LAB — TYPE AND SCREEN
ABO/RH(D): O POS
ANTIBODY SCREEN: NEGATIVE

## 2018-04-14 ENCOUNTER — Encounter (HOSPITAL_COMMUNITY): Payer: Self-pay | Admitting: *Deleted

## 2018-04-14 ENCOUNTER — Inpatient Hospital Stay (HOSPITAL_COMMUNITY)
Admission: RE | Admit: 2018-04-14 | Discharge: 2018-04-16 | DRG: 788 | Disposition: A | Discharge status: Home / Self Care | Payer: BLUE CROSS/BLUE SHIELD | Attending: Obstetrics & Gynecology | Admitting: Obstetrics & Gynecology

## 2018-04-14 ENCOUNTER — Inpatient Hospital Stay (HOSPITAL_COMMUNITY): Payer: BLUE CROSS/BLUE SHIELD | Admitting: Anesthesiology

## 2018-04-14 ENCOUNTER — Encounter (HOSPITAL_COMMUNITY)
Admission: RE | Disposition: A | Discharge status: Home / Self Care | Payer: Self-pay | Source: Home / Self Care | Attending: Obstetrics & Gynecology

## 2018-04-14 DIAGNOSIS — Z3A39 39 weeks gestation of pregnancy: Secondary | ICD-10-CM | POA: Diagnosis not present

## 2018-04-14 DIAGNOSIS — Z98891 History of uterine scar from previous surgery: Secondary | ICD-10-CM

## 2018-04-14 DIAGNOSIS — O99019 Anemia complicating pregnancy, unspecified trimester: Secondary | ICD-10-CM

## 2018-04-14 DIAGNOSIS — O321XX0 Maternal care for breech presentation, not applicable or unspecified: Principal | ICD-10-CM | POA: Diagnosis present

## 2018-04-14 DIAGNOSIS — D649 Anemia, unspecified: Secondary | ICD-10-CM | POA: Diagnosis present

## 2018-04-14 DIAGNOSIS — O34219 Maternal care for unspecified type scar from previous cesarean delivery: Secondary | ICD-10-CM

## 2018-04-14 DIAGNOSIS — O9902 Anemia complicating childbirth: Secondary | ICD-10-CM | POA: Diagnosis present

## 2018-04-14 HISTORY — DX: History of uterine scar from previous surgery: Z98.891

## 2018-04-14 LAB — RPR: RPR Ser Ql: NONREACTIVE

## 2018-04-14 SURGERY — Surgical Case
Anesthesia: Spinal | Site: Abdomen | Wound class: Clean Contaminated

## 2018-04-14 MED ORDER — ENOXAPARIN SODIUM 40 MG/0.4ML ~~LOC~~ SOLN
40.0000 mg | SUBCUTANEOUS | Status: DC
  Administered 2018-04-15 – 2018-04-16 (×2): 40 mg via SUBCUTANEOUS
  Filled 2018-04-14 (×2): qty 0.4

## 2018-04-14 MED ORDER — DIPHENHYDRAMINE HCL 50 MG/ML IJ SOLN: 12.5000 mg | INTRAMUSCULAR | Status: DC | PRN

## 2018-04-14 MED ORDER — DEXAMETHASONE SODIUM PHOSPHATE 10 MG/ML IJ SOLN
INTRAMUSCULAR | Status: DC | PRN
  Administered 2018-04-14: 4 mg via INTRAVENOUS

## 2018-04-14 MED ORDER — NALBUPHINE HCL 10 MG/ML IJ SOLN: 5.0000 mg | INTRAMUSCULAR | Status: DC | PRN

## 2018-04-14 MED ORDER — TETANUS-DIPHTH-ACELL PERTUSSIS 5-2.5-18.5 LF-MCG/0.5 IM SUSP: 0.5000 mL | Freq: Once | INTRAMUSCULAR | Status: DC

## 2018-04-14 MED ORDER — ACETAMINOPHEN 325 MG PO TABS: 650.0000 mg | ORAL_TABLET | ORAL | Status: DC | PRN

## 2018-04-14 MED ORDER — MEPERIDINE HCL 25 MG/ML IJ SOLN: 6.2500 mg | INTRAMUSCULAR | Status: DC | PRN

## 2018-04-14 MED ORDER — LACTATED RINGERS IV SOLN
INTRAVENOUS | Status: DC | PRN
  Administered 2018-04-14: 12:00:00 via INTRAVENOUS

## 2018-04-14 MED ORDER — OXYCODONE HCL 5 MG PO TABS: 5.0000 mg | ORAL_TABLET | ORAL | Status: DC | PRN

## 2018-04-14 MED ORDER — OXYCODONE HCL 5 MG/5ML PO SOLN: 5.0000 mg | Freq: Once | ORAL | Status: DC | PRN

## 2018-04-14 MED ORDER — BUPIVACAINE HCL (PF) 0.5 % IJ SOLN
INTRAMUSCULAR | Status: DC | PRN
  Administered 2018-04-14: 30 mL

## 2018-04-14 MED ORDER — MENTHOL 3 MG MT LOZG: 1.0000 | LOZENGE | OROMUCOSAL | Status: DC | PRN

## 2018-04-14 MED ORDER — PRENATAL MULTIVITAMIN CH
1.0000 | ORAL_TABLET | Freq: Every day | ORAL | Status: DC
  Administered 2018-04-15 – 2018-04-16 (×2): 1 via ORAL
  Filled 2018-04-14 (×2): qty 1

## 2018-04-14 MED ORDER — KETOROLAC TROMETHAMINE 30 MG/ML IJ SOLN
INTRAMUSCULAR | Status: AC
  Filled 2018-04-14: qty 1

## 2018-04-14 MED ORDER — MEASLES, MUMPS & RUBELLA VAC ~~LOC~~ INJ
0.5000 mL | INJECTION | Freq: Once | SUBCUTANEOUS | Status: DC
  Filled 2018-04-14: qty 0.5

## 2018-04-14 MED ORDER — LACTATED RINGERS IV SOLN
INTRAVENOUS | Status: DC
  Administered 2018-04-14 (×3): via INTRAVENOUS

## 2018-04-14 MED ORDER — DEXAMETHASONE SODIUM PHOSPHATE 10 MG/ML IJ SOLN
INTRAMUSCULAR | Status: AC
  Filled 2018-04-14: qty 1

## 2018-04-14 MED ORDER — NALBUPHINE HCL 10 MG/ML IJ SOLN
5.0000 mg | Freq: Once | INTRAMUSCULAR | Status: AC | PRN
  Administered 2018-04-14: 5 mg via INTRAVENOUS

## 2018-04-14 MED ORDER — SCOPOLAMINE 1 MG/3DAYS TD PT72
MEDICATED_PATCH | TRANSDERMAL | Status: AC
  Filled 2018-04-14: qty 1

## 2018-04-14 MED ORDER — DIPHENHYDRAMINE HCL 25 MG PO CAPS
25.0000 mg | ORAL_CAPSULE | ORAL | Status: DC | PRN
  Filled 2018-04-14: qty 1

## 2018-04-14 MED ORDER — PHENYLEPHRINE 8 MG IN D5W 100 ML (0.08MG/ML) PREMIX OPTIME
INJECTION | INTRAVENOUS | Status: DC | PRN
  Administered 2018-04-14: 60 ug/min via INTRAVENOUS

## 2018-04-14 MED ORDER — FENTANYL CITRATE (PF) 100 MCG/2ML IJ SOLN
INTRAMUSCULAR | Status: AC
  Filled 2018-04-14: qty 2

## 2018-04-14 MED ORDER — CEFAZOLIN SODIUM-DEXTROSE 2-4 GM/100ML-% IV SOLN
2.0000 g | INTRAVENOUS | Status: AC
  Administered 2018-04-14: 2 g via INTRAVENOUS
  Filled 2018-04-14: qty 100

## 2018-04-14 MED ORDER — MORPHINE SULFATE (PF) 0.5 MG/ML IJ SOLN
INTRAMUSCULAR | Status: DC | PRN
  Administered 2018-04-14: .15 mg via INTRATHECAL

## 2018-04-14 MED ORDER — SIMETHICONE 80 MG PO CHEW: 80.0000 mg | CHEWABLE_TABLET | ORAL | Status: DC | PRN

## 2018-04-14 MED ORDER — IBUPROFEN 600 MG PO TABS
600.0000 mg | ORAL_TABLET | Freq: Four times a day (QID) | ORAL | Status: DC
  Administered 2018-04-14 – 2018-04-16 (×8): 600 mg via ORAL
  Filled 2018-04-14 (×8): qty 1

## 2018-04-14 MED ORDER — ONDANSETRON HCL 4 MG/2ML IJ SOLN
INTRAMUSCULAR | Status: AC
  Filled 2018-04-14: qty 2

## 2018-04-14 MED ORDER — BUPIVACAINE HCL (PF) 0.5 % IJ SOLN
INTRAMUSCULAR | Status: AC
  Filled 2018-04-14: qty 30

## 2018-04-14 MED ORDER — NALBUPHINE HCL 10 MG/ML IJ SOLN
5.0000 mg | INTRAMUSCULAR | Status: DC | PRN
  Filled 2018-04-14: qty 1

## 2018-04-14 MED ORDER — NALBUPHINE HCL 10 MG/ML IJ SOLN: 5.0000 mg | Freq: Once | INTRAMUSCULAR | Status: AC | PRN

## 2018-04-14 MED ORDER — FENTANYL CITRATE (PF) 100 MCG/2ML IJ SOLN
INTRAMUSCULAR | Status: DC | PRN
  Administered 2018-04-14: 15 ug via INTRATHECAL

## 2018-04-14 MED ORDER — OXYCODONE HCL 5 MG PO TABS: 10.0000 mg | ORAL_TABLET | ORAL | Status: DC | PRN

## 2018-04-14 MED ORDER — OXYCODONE HCL 5 MG PO TABS: 5.0000 mg | ORAL_TABLET | Freq: Once | ORAL | Status: DC | PRN

## 2018-04-14 MED ORDER — OXYTOCIN 40 UNITS IN LACTATED RINGERS INFUSION - SIMPLE MED: 2.5000 [IU]/h | INTRAVENOUS | Status: AC

## 2018-04-14 MED ORDER — ONDANSETRON HCL 4 MG/2ML IJ SOLN: 4.0000 mg | Freq: Three times a day (TID) | INTRAMUSCULAR | Status: DC | PRN

## 2018-04-14 MED ORDER — PROMETHAZINE HCL 25 MG/ML IJ SOLN: 6.2500 mg | INTRAMUSCULAR | Status: DC | PRN

## 2018-04-14 MED ORDER — ONDANSETRON HCL 4 MG/2ML IJ SOLN
INTRAMUSCULAR | Status: DC | PRN
  Administered 2018-04-14: 4 mg via INTRAVENOUS

## 2018-04-14 MED ORDER — NALOXONE HCL 0.4 MG/ML IJ SOLN: 0.4000 mg | INTRAMUSCULAR | Status: DC | PRN

## 2018-04-14 MED ORDER — OXYTOCIN 10 UNIT/ML IJ SOLN
INTRAMUSCULAR | Status: AC
  Filled 2018-04-14: qty 4

## 2018-04-14 MED ORDER — BUPIVACAINE IN DEXTROSE 0.75-8.25 % IT SOLN
INTRATHECAL | Status: DC | PRN
  Administered 2018-04-14: 1.4 mL via INTRATHECAL

## 2018-04-14 MED ORDER — DIBUCAINE 1 % RE OINT: 1.0000 "application " | TOPICAL_OINTMENT | RECTAL | Status: DC | PRN

## 2018-04-14 MED ORDER — SOD CITRATE-CITRIC ACID 500-334 MG/5ML PO SOLN
30.0000 mL | ORAL | Status: AC
  Administered 2018-04-14: 30 mL via ORAL
  Filled 2018-04-14: qty 15

## 2018-04-14 MED ORDER — WITCH HAZEL-GLYCERIN EX PADS: 1.0000 "application " | MEDICATED_PAD | CUTANEOUS | Status: DC | PRN

## 2018-04-14 MED ORDER — SCOPOLAMINE 1 MG/3DAYS TD PT72: 1.0000 | MEDICATED_PATCH | Freq: Once | TRANSDERMAL | Status: DC

## 2018-04-14 MED ORDER — SENNOSIDES-DOCUSATE SODIUM 8.6-50 MG PO TABS
2.0000 | ORAL_TABLET | ORAL | Status: DC
  Administered 2018-04-15 (×2): 2 via ORAL
  Filled 2018-04-14 (×2): qty 2

## 2018-04-14 MED ORDER — DIPHENHYDRAMINE HCL 25 MG PO CAPS: 25.0000 mg | ORAL_CAPSULE | Freq: Four times a day (QID) | ORAL | Status: DC | PRN

## 2018-04-14 MED ORDER — OXYTOCIN 10 UNIT/ML IJ SOLN
INTRAVENOUS | Status: DC | PRN
  Administered 2018-04-14: 40 [IU] via INTRAVENOUS

## 2018-04-14 MED ORDER — LACTATED RINGERS IV SOLN
INTRAVENOUS | Status: DC
  Administered 2018-04-14 – 2018-04-15 (×2): via INTRAVENOUS

## 2018-04-14 MED ORDER — SIMETHICONE 80 MG PO CHEW
80.0000 mg | CHEWABLE_TABLET | ORAL | Status: DC
  Administered 2018-04-15 (×2): 80 mg via ORAL
  Filled 2018-04-14 (×2): qty 1

## 2018-04-14 MED ORDER — KETOROLAC TROMETHAMINE 30 MG/ML IJ SOLN
30.0000 mg | Freq: Once | INTRAMUSCULAR | Status: AC
  Administered 2018-04-14: 30 mg via INTRAMUSCULAR

## 2018-04-14 MED ORDER — ZOLPIDEM TARTRATE 5 MG PO TABS: 5.0000 mg | ORAL_TABLET | Freq: Every evening | ORAL | Status: DC | PRN

## 2018-04-14 MED ORDER — SODIUM CHLORIDE 0.9% FLUSH: 3.0000 mL | INTRAVENOUS | Status: DC | PRN

## 2018-04-14 MED ORDER — STERILE WATER FOR IRRIGATION IR SOLN
Status: DC | PRN
  Administered 2018-04-14: 1000 mL

## 2018-04-14 MED ORDER — NALOXONE HCL 4 MG/10ML IJ SOLN
1.0000 ug/kg/h | INTRAVENOUS | Status: DC | PRN
  Filled 2018-04-14: qty 5

## 2018-04-14 MED ORDER — COCONUT OIL OIL
1.0000 "application " | TOPICAL_OIL | Status: DC | PRN
  Filled 2018-04-14: qty 120

## 2018-04-14 MED ORDER — MORPHINE SULFATE (PF) 0.5 MG/ML IJ SOLN
INTRAMUSCULAR | Status: AC
  Filled 2018-04-14: qty 10

## 2018-04-14 MED ORDER — SODIUM CHLORIDE 0.9 % IR SOLN
Status: DC | PRN
  Administered 2018-04-14: 1000 mL

## 2018-04-14 MED ORDER — SCOPOLAMINE 1 MG/3DAYS TD PT72
MEDICATED_PATCH | TRANSDERMAL | Status: DC | PRN
  Administered 2018-04-14: 1 via TRANSDERMAL

## 2018-04-14 MED ORDER — PHENYLEPHRINE 8 MG IN D5W 100 ML (0.08MG/ML) PREMIX OPTIME
INJECTION | INTRAVENOUS | Status: AC
  Filled 2018-04-14: qty 100

## 2018-04-14 MED ORDER — HYDROMORPHONE HCL 1 MG/ML IJ SOLN: 0.2500 mg | INTRAMUSCULAR | Status: DC | PRN

## 2018-04-14 MED ORDER — SIMETHICONE 80 MG PO CHEW
80.0000 mg | CHEWABLE_TABLET | Freq: Three times a day (TID) | ORAL | Status: DC
  Administered 2018-04-14 – 2018-04-16 (×6): 80 mg via ORAL
  Filled 2018-04-14 (×6): qty 1

## 2018-04-14 SURGICAL SUPPLY — 27 items
CHLORAPREP W/TINT 26ML (MISCELLANEOUS) ×3 IMPLANT
CLAMP CORD UMBIL (MISCELLANEOUS) ×3 IMPLANT
CLOTH BEACON ORANGE TIMEOUT ST (SAFETY) ×3 IMPLANT
DRSG OPSITE POSTOP 4X10 (GAUZE/BANDAGES/DRESSINGS) ×3 IMPLANT
ELECT REM PT RETURN 9FT ADLT (ELECTROSURGICAL) ×3
ELECTRODE REM PT RTRN 9FT ADLT (ELECTROSURGICAL) ×1 IMPLANT
GLOVE BIO SURGEON STRL SZ 6.5 (GLOVE) ×2 IMPLANT
GLOVE BIO SURGEONS STRL SZ 6.5 (GLOVE) ×1
GLOVE BIOGEL PI IND STRL 7.0 (GLOVE) ×2 IMPLANT
GLOVE BIOGEL PI INDICATOR 7.0 (GLOVE) ×4
GOWN STRL REUS W/TWL LRG LVL3 (GOWN DISPOSABLE) ×6 IMPLANT
NEEDLE HYPO 25X5/8 SAFETYGLIDE (NEEDLE) ×3 IMPLANT
NS IRRIG 1000ML POUR BTL (IV SOLUTION) ×3 IMPLANT
PACK C SECTION WH (CUSTOM PROCEDURE TRAY) ×3 IMPLANT
PAD OB MATERNITY 4.3X12.25 (PERSONAL CARE ITEMS) ×3 IMPLANT
PENCIL SMOKE EVAC W/HOLSTER (ELECTROSURGICAL) ×3 IMPLANT
RETRACTOR WND ALEXIS 25 LRG (MISCELLANEOUS) ×1 IMPLANT
RTRCTR WOUND ALEXIS 25CM LRG (MISCELLANEOUS) ×3
SPONGE LAP 18X18 RF (DISPOSABLE) ×9 IMPLANT
SUT VIC AB 0 CT1 36 (SUTURE) ×18 IMPLANT
SUT VIC AB 2-0 CT1 27 (SUTURE) ×3
SUT VIC AB 2-0 CT1 TAPERPNT 27 (SUTURE) ×1 IMPLANT
SUT VIC AB 4-0 PS2 18 (SUTURE) ×3 IMPLANT
SUT VIC AB 4-0 PS2 27 (SUTURE) ×3 IMPLANT
SYR CONTROL 10ML LL (SYRINGE) ×3 IMPLANT
TOWEL OR 17X24 6PK STRL BLUE (TOWEL DISPOSABLE) ×3 IMPLANT
TRAY FOLEY W/BAG SLVR 14FR LF (SET/KITS/TRAYS/PACK) ×3 IMPLANT

## 2018-04-14 NOTE — Anesthesia Procedure Notes (Signed)
Spinal  Patient location during procedure: OR Start time: 04/14/2018 11:15 AM End time: 04/14/2018 11:25 AM Staffing Anesthesiologist: Leonides Grills, MD Performed: anesthesiologist  Preanesthetic Checklist Completed: patient identified, surgical consent, pre-op evaluation, timeout performed, IV checked, risks and benefits discussed and monitors and equipment checked Spinal Block Patient position: sitting Prep: DuraPrep Patient monitoring: cardiac monitor, continuous pulse ox and blood pressure Approach: midline Location: L4-5 Injection technique: single-shot Needle Needle type: Pencan  Needle gauge: 24 G Needle length: 9 cm Assessment Sensory level: T10 Additional Notes Functioning IV was confirmed and monitors were applied. Sterile prep and drape, including hand hygiene and sterile gloves were used. The patient was positioned and the spine was prepped. The skin was anesthetized with lidocaine.  Free flow of clear CSF was obtained prior to injecting local anesthetic into the CSF.  The spinal needle aspirated freely following injection.  The needle was carefully withdrawn.  The patient tolerated the procedure well.

## 2018-04-14 NOTE — Transfer of Care (Signed)
Immediate Anesthesia Transfer of Care Note  Patient: Alicia Rich  Procedure(s) Performed: CESAREAN SECTION (N/A Abdomen)  Patient Location: PACU  Anesthesia Type:Spinal  Level of Consciousness: awake, alert  and oriented  Airway & Oxygen Therapy: Patient Spontanous Breathing  Post-op Assessment: Report given to RN and Post -op Vital signs reviewed and stable  Post vital signs: Reviewed and stable  Last Vitals:  Vitals Value Taken Time  BP 156/67 04/14/2018 12:40 PM  Temp    Pulse 90 04/14/2018 12:42 PM  Resp 16 04/14/2018 12:42 PM  SpO2 100 % 04/14/2018 12:42 PM  Vitals shown include unvalidated device data.  Last Pain:  Vitals:   04/14/18 0950  TempSrc:   PainSc: 5          Complications: No apparent anesthesia complications

## 2018-04-14 NOTE — Anesthesia Preprocedure Evaluation (Signed)
Anesthesia Evaluation  Patient identified by MRN, date of birth, ID band Patient awake    Reviewed: Allergy & Precautions, NPO status , Patient's Chart, lab work & pertinent test results  Airway Mallampati: II  TM Distance: >3 FB Neck ROM: Full    Dental no notable dental hx.    Pulmonary neg pulmonary ROS,    Pulmonary exam normal breath sounds clear to auscultation       Cardiovascular negative cardio ROS Normal cardiovascular exam Rhythm:Regular Rate:Normal     Neuro/Psych negative neurological ROS  negative psych ROS   GI/Hepatic negative GI ROS, Neg liver ROS,   Endo/Other  negative endocrine ROS  Renal/GU negative Renal ROS     Musculoskeletal negative musculoskeletal ROS (+)   Abdominal   Peds  Hematology negative hematology ROS (+)   Anesthesia Other Findings Breech  Reproductive/Obstetrics (+) Pregnancy                             Anesthesia Physical Anesthesia Plan  ASA: II  Anesthesia Plan: Spinal   Post-op Pain Management:    Induction:   PONV Risk Score and Plan: 2 and Scopolamine patch - Pre-op, Ondansetron, Dexamethasone and Treatment may vary due to age or medical condition  Airway Management Planned: Natural Airway  Additional Equipment:   Intra-op Plan:   Post-operative Plan:   Informed Consent: I have reviewed the patients History and Physical, chart, labs and discussed the procedure including the risks, benefits and alternatives for the proposed anesthesia with the patient or authorized representative who has indicated his/her understanding and acceptance.   Dental advisory given  Plan Discussed with: CRNA  Anesthesia Plan Comments:         Anesthesia Quick Evaluation

## 2018-04-14 NOTE — H&P (Signed)
Obstetric Preoperative History and Physical  Alicia Rich is a 22 y.o. G1P0000 with IUP at [redacted]w[redacted]d presenting for scheduled cesarean section for breech presentation s/p failed ECV.  No acute concerns.   Prenatal Course Source of Care: [name]  with onset of care at 9 weeks Pregnancy complications or risks: Patient Active Problem List   Diagnosis Date Noted  . Breech presentation 04/06/2018  . Anemia affecting pregnancy, antepartum 02/01/2018  . Nausea and vomiting during pregnancy prior to [redacted] weeks gestation 11/08/2017  . Supervision of normal first pregnancy, antepartum 09/20/2017   She plans to breastfeed She desires oral contraceptives (estrogen/progesterone) for postpartum contraception.   Prenatal labs and studies: ABO, Rh: --/--/O POS (10/04 0930) Antibody: NEG (10/04 0930) Rubella: 1.55 (03/13 1105) RPR: Non Reactive (10/04 0930)  HBsAg: Negative (03/13 1105)  HIV: Non Reactive (07/24 0847)  GBS: Negative  2 hr Glucola: Normal  Genetic screening normal Anatomy US normal  Prenatal Transfer Tool  Maternal Diabetes: No Genetic Screening: Normal Maternal Ultrasounds/Referrals: Normal Fetal Ultrasounds or other Referrals:  None Maternal Substance Abuse:  No Significant Maternal Medications:  None Significant Maternal Lab Results: None  Past Medical History:  Diagnosis Date  . Medical history non-contributory     Past Surgical History:  Procedure Laterality Date  . NO PAST SURGERIES      OB History  Gravida Para Term Preterm AB Living  1 0 0 0 0 0  SAB TAB Ectopic Multiple Live Births  0 0 0 0 0    # Outcome Date GA Lbr Len/2nd Weight Sex Delivery Anes PTL Lv  1 Current             Social History   Socioeconomic History  . Marital status: Married    Spouse name: Not on file  . Number of children: Not on file  . Years of education: Not on file  . Highest education level: Not on file  Occupational History  . Not on file  Social Needs  . Financial  resource strain: Not hard at all  . Food insecurity:    Worry: Never true    Inability: Never true  . Transportation needs:    Medical: No    Non-medical: Not on file  Tobacco Use  . Smoking status: Never Smoker  . Smokeless tobacco: Never Used  Substance and Sexual Activity  . Alcohol use: No  . Drug use: No  . Sexual activity: Yes  Lifestyle  . Physical activity:    Days per week: Not on file    Minutes per session: Not on file  . Stress: To some extent  Relationships  . Social connections:    Talks on phone: Not on file    Gets together: Not on file    Attends religious service: Not on file    Active member of club or organization: Not on file    Attends meetings of clubs or organizations: Not on file    Relationship status: Not on file  Other Topics Concern  . Not on file  Social History Narrative   Married.   No children.   Works as a Lawyer for [employer].   She aspires to take classes for nursing or cosmetology.     Family History  Problem Relation Age of Onset  . Heart attack Maternal Grandfather   . Heart disease Maternal Grandfather   . Heart disease Paternal Grandfather   . Stroke Paternal Grandfather     Medications Prior to Admission  Medication  Sig Dispense Refill Last Dose  . ferrous sulfate 325 (65 FE) MG tablet Take 1 tablet (325 mg total) by mouth daily with breakfast. 30 tablet 3 Taking  . FIBER ADULT GUMMIES 2 g CHEW Chew 2 tablets by mouth daily.    Taking  . Prenatal Vit-Fe Fumarate-FA (PRENATAL MULTIVITAMIN) TABS tablet Take 1 tablet by mouth daily.    Taking    No Known Allergies  Review of Systems: Negative except for what is mentioned in HPI.  Physical Exam: BP 117/89 (BP Location: Right Arm)   Pulse 96   Temp 99 F (37.2 C) (Oral)   Resp 16   Ht 5\' 1"  (1.549 m)   Wt 63.8 kg   LMP 07/15/2017   BMI 26.57 kg/m  FHR by Doppler: 143-154 bpm CONSTITUTIONAL: Well-developed, well-nourished female in no acute distress.  HENT:   Normocephalic, atraumatic. Oropharynx is clear and moist EYES: Conjunctivae and EOM are normal. No scleral icterus.  NECK: Normal range of motion, supple SKIN: Skin is warm and dry. No rash noted. Not diaphoretic. No erythema. NEUROLGIC: Alert and oriented to person, place, and time. Normal reflexes, muscle tone coordination. No cranial nerve deficit noted. PSYCHIATRIC: Normal mood and affect. Normal behavior. CARDIOVASCULAR: Normal heart rate noted, regular rhythm RESPIRATORY: Effort and breath sounds normal, no problems with respiration noted ABDOMEN: Soft, nontender, nondistended, gravid.  PELVIC: Deferred MUSCULOSKELETAL: Normal range of motion. No edema and no tenderness. 2+ distal pulses.  Confirmed breech presentation with bedside sono.    Pertinent Labs/Studies:   Results for orders placed or performed during the hospital encounter of 04/13/18 (from the past 72 hour(s))  CBC     Status: None   Collection Time: 04/13/18  9:30 AM  Result Value Ref Range   WBC 9.1 4.0 - 10.5 K/uL   RBC 4.06 3.87 - 5.11 MIL/uL   Hemoglobin 12.4 12.0 - 15.0 g/dL   HCT 16.1 09.6 - 04.5 %   MCV 90.9 78.0 - 100.0 fL   MCH 30.5 26.0 - 34.0 pg   MCHC 33.6 30.0 - 36.0 g/dL   RDW 40.9 81.1 - 91.4 %   Platelets 254 150 - 400 K/uL    Comment: Performed at [facility], [address] [place], [place] [address]  Type and screen     Status: None   Collection Time: 04/13/18  9:30 AM  Result Value Ref Range   ABO/RH(D) O POS    Antibody Screen NEG    Sample Expiration      04/16/2018 Performed at [facility], [address], [place], [place] [address]   RPR     Status: None   Collection Time: 04/13/18  9:30 AM  Result Value Ref Range   RPR Ser Ql Non Reactive Non Reactive    Comment: (NOTE) Performed At: [facility] [address] [place], [place] [id number] Jolene Schimke MD EX:[id number]     Assessment and Plan :[name] is a 22 y.o. G1P0000 at [redacted]w[redacted]d being admitted  for scheduled cesarean section for breech presentation. No prior surgeries. The risks of cesarean section discussed with the patient included but were not limited to: bleeding which may require transfusion or reoperation; infection which may require antibiotics; injury to bowel, bladder, ureters or other surrounding organs; injury to the fetus; need for additional procedures including hysterectomy in the event of a life-threatening hemorrhage; placental abnormalities wth subsequent pregnancies, incisional problems, thromboembolic phenomenon and other postoperative/anesthesia complications. The patient concurred with the proposed plan, giving informed written  consent for the procedure. Patient has been NPO since last night she will remain NPO for procedure. Anesthesia and OR aware. Preoperative prophylactic antibiotics and SCDs ordered on call to the OR. To OR when ready.    Marcy Siren, D.O. OB Fellow  04/14/2018, 10:05 AM

## 2018-04-14 NOTE — Discharge Summary (Signed)
Alicia Rich is a 22 y.o. female presenting for ECV, breech presentation at office visit 2 days ago.         OB History    Gravida  1   Para  0   Term  0   Preterm  0   AB  0   Living  0     SAB  0   TAB  0   Ectopic  0   Multiple  0   Live Births  0              Past Medical History:  Diagnosis Date  . Medical history non-contributory         Past Surgical History:  Procedure Laterality Date  . NO PAST SURGERIES     Family History: family history includes Heart attack in her maternal grandfather; Heart disease in her maternal grandfather and paternal grandfather; Stroke in her paternal grandfather. Social History:  reports that she has never smoked. She has never used smokeless tobacco. She reports that she does not drink alcohol or use drugs.     Maternal Diabetes: No Genetic Screening: Normal Maternal Ultrasounds/Referrals: Normal Fetal Ultrasounds or other Referrals:  None Maternal Substance Abuse:  No Significant Maternal Medications:  None Significant Maternal Lab Results:  None Other Comments:  breech  Review of Systems  Constitutional: Negative.   Cardiovascular: Negative.   Gastrointestinal: Negative.   Genitourinary: Negative.    Maternal Medical History:  Reason for admission: Elective ECV  Contractions: Frequency: rare.    Fetal activity: Perceived fetal activity is normal.    Prenatal complications: no prenatal complications Prenatal Complications - Diabetes: none.    Blood pressure 116/67, pulse 82, temperature 98.2 F (36.8 C), temperature source Oral, resp. rate 18, height 5' (1.524 m), weight 62.1 kg, last menstrual period 07/15/2017. Maternal Exam:  Uterine Assessment: Contraction frequency is rare.   Abdomen: Patient reports no abdominal tenderness. Fetal presentation: breech  Introitus: not evaluated.     Physical Exam  Vitals reviewed. Constitutional: She is oriented to person, place,  and time. No distress.  Cardiovascular: Normal rate.  GI:  Gravid, US shows subjectively nl AF, breech head RUQ  Neurological: She is alert and oriented to person, place, and time.  Skin: Skin is warm and dry.  Psychiatric: She has a normal mood and affect. Her behavior is normal.    Prenatal labs: ABO, Rh: O/Positive/-- (03/13 1105) Antibody: Negative (03/13 1105) Rubella: 1.55 (03/13 1105) RPR: Non Reactive (07/24 0847)  HBsAg: Negative (03/13 1105)  HIV: Non Reactive (07/24 0847)  GBS:     Assessment/Plan: Breech presentation for ECV.  See procedure note    External Cephalic Version  Preprocedure Diagnosis:  22 y.o. G1P0 at [redacted]w[redacted]d  weeks gestational age with breech presentation  Post-procedure Diagnosis: same  Procedure:  Attempted external version unsuccessful  Procedure in detail:   The patient was brought to Labor and Delivery where a reactive fetal heart tracing was obtained. The patient was noted to have irregular contractions. She was given 1 dose of subcutaneous terbutaline which resolved her contraction. A bedside ultrasound was performed which revealed single intrauterine pregnancy and breech presentation. There was noted to be adequate fluid. Using manual pressure, the breech was manipulated in a forward roll fashion with several attempts but version was not obtained. Fetal heart tones were checked intermittently during the procedure and were noted to be reassuring. Following unsuccessful external cephalic version, the patient was placed on continuous external fetal monitoring.  She was noted to have a reassuring and reactive tracing for 1 hour following the external cephalic version. She did not have regular contractions and therefore she was felt to be stable for discharge to home. She was given appropriate labor instructions. Patient ID: Alicia Rich, female   DOB: 08/25/1995, 22 y.o.   MRN: [id number]  Alicia Rich 04/06/2018, 9:33 AM

## 2018-04-14 NOTE — Progress Notes (Signed)
FHR doppler [id number] at 0958 04/14/2018 Loetta Rough, RN

## 2018-04-14 NOTE — Lactation Note (Signed)
This note was copied from a baby's chart. Lactation Consultation Note  Patient Name: Alicia Rich [id number] Date: 04/14/2018 Reason for consult: Initial assessment;Primapara;1st time breastfeeding;Term  7 hours old FT female who is being exclusively BF by her mother, she's a P1. When [facility] assisted with hand expression, two big drops of colostrum were noted, mom also stated she experienced (+) breast changes during the pregnancy. Dad already ordered a DEBP from his insurance and it's coming in the mail anytime.  Offered assistance with latch but mom politely declined stating that baby already fed; baby was asleep and swaddled on mother's breast. Parents were very engaged with [facility] consultation and had lots of questions; discussed onset of lactogenesis II, size of baby's stomach, pumping, they also asked about the need for supplementation.  Feeding plan:  1. Encouraged mom to feed baby STS 8-12 times/24 hours or sooner if feeding cues are present. 2. Hand expression and spoon feeding was also encouraged 3. Parents aware of the need of supplementation if baby drops under 6 lbs, mom will let RN know whenever she's ready to start double pumping.  BF brochure, BF resources and feeding diary were reviewed. Parents reported all questions were answered, they're both aware of LC services and will call PRN.  Maternal Data Formula Feeding for Exclusion: No Has patient been taught Hand Expression?: Yes Does the patient have breastfeeding experience prior to this delivery?: No  Feeding Feeding Type: Breast Fed  LATCH Score Latch: Repeated attempts needed to sustain latch, nipple held in mouth throughout feeding, stimulation needed to elicit sucking reflex.  Audible Swallowing: None  Type of Nipple: Everted at rest and after stimulation  Comfort (Breast/Nipple): Soft / non-tender  Hold (Positioning): Assistance needed to correctly position infant at breast and maintain latch.  LATCH Score:  6  Interventions Interventions: Breast feeding basics reviewed;Breast massage;Hand express;Breast compression  Lactation Tools Discussed/Used WIC Program: No   Consult Status Consult Status: Follow-up Date: 04/15/18 Follow-up type: In-patient    Ibrahima Holberg Venetia Constable 04/14/2018, 7:01 PM

## 2018-04-14 NOTE — Op Note (Signed)
Cesarean Section Operative Report  PATIENT: Alicia Rich  PROCEDURE DATE: 04/14/2018  PREOPERATIVE DIAGNOSES: Intrauterine pregnancy at [redacted]w[redacted]d weeks gestation; malpresentation: breech, failed ECV  POSTOPERATIVE DIAGNOSES: The same  PROCEDURE: PrimaryLow Transverse Cesarean Section  SURGEON:   Surgeon(s) and Role:    * Adam Phenix, MD - Primary    * Arvilla Market, DO - Assisting - OB Fellow    INDICATIONS: Alicia Rich is a 22 y.o. G1P0000 at [redacted]w[redacted]d here for cesarean section secondary to the indications listed under preoperative diagnoses; please see preoperative note for further details.  The risks of cesarean section were discussed with the patient including but were not limited to: bleeding which may require transfusion or reoperation; infection which may require antibiotics; injury to bowel, bladder, ureters or other surrounding organs; injury to the fetus; need for additional procedures including hysterectomy in the event of a life-threatening hemorrhage; placental abnormalities wth subsequent pregnancies, incisional problems, thromboembolic phenomenon and other postoperative/anesthesia complications.   The patient concurred with the proposed plan, giving informed written consent for the procedure.    FINDINGS:  Viable female infant in cephalic presentation.  Apgars 8 and 9.  Clear amniotic fluid.  Intact placenta, three vessel cord.  Normal uterus, fallopian tubes and ovaries bilaterally.  ANESTHESIA: Spinal INTRAVENOUS FLUIDS: 2500 mL  ESTIMATED BLOOD LOSS: 151 mL URINE OUTPUT:  250 ml SPECIMENS: Placenta sent to L&D COMPLICATIONS: None immediate  PROCEDURE IN DETAIL:  The patient preoperatively received intravenous antibiotics and had sequential compression devices applied to her lower extremities.  She was then taken to the operating room where spinal anesthesia was administered and was found to be adequate. She was then placed in a dorsal supine position with  a leftward tilt, and prepped and draped in a sterile manner.  A foley catheter was placed into her bladder and attached to constant gravity.    After an adequate timeout was performed, a Pfannenstiel skin incision was made with scalpel and carried through to the underlying layer of fascia. The fascia was incised in the midline, and this incision was extended bilaterally using the Mayo scissors.  Kocher clamps were applied to the superior aspect of the fascial incision and the underlying rectus muscles were dissected off bluntly.  A similar process was carried out on the inferior aspect of the fascial incision. The rectus muscles were separated in the midline bluntly and the peritoneum was entered bluntly. Attention was turned to the lower uterine segment where a low transverse hysterotomy was made with a scalpel and extended bilaterally bluntly.  The infant was successfully delivered, the cord was clamped and cut after one minute, and the infant was handed over to the awaiting neonatology team. Uterine massage was then administered, and the placenta delivered intact with a three-vessel cord. The uterus was then cleared of clots and debris.  The hysterotomy was closed with 0 Vicryl in a running locked fashion, and an imbricating layer was also placed with 0 Vicryl.The pelvis was cleared of all clot and debris. Hemostasis was confirmed on all surfaces.  The peritoneum was closed with a 0 Vicryl running stitch. The fascia was then closed using 0 Vicryl in a running fashion.  The subcutaneous layer was irrigated and 30 ml of 0.5% Marcaine was injected subcutaneously around the incision.  The skin was closed with a 4-0 Vicryl subcuticular stitch.   The patient tolerated the procedure well. Sponge, lap, instrument and needle counts were correct x 3.  She was taken to the recovery room  in stable condition.   An experienced assistant was required given the standard of surgical care given the complexity of the case.   This assistant was needed for exposure, dissection, suctioning, retraction, instrument exchange, assisting with delivery with administration of fundal pressure, and for overall help during the procedure.   Maternal Disposition: PACU - hemodynamically stable.   Infant Disposition: stable   Marcy Siren, D.O. OB Fellow  04/14/2018, 12:38 PM

## 2018-04-14 NOTE — Anesthesia Postprocedure Evaluation (Signed)
Anesthesia Post Note  Patient: Kasia Trego  Procedure(s) Performed: CESAREAN SECTION (N/A Abdomen)     Patient location during evaluation: PACU Anesthesia Type: Spinal Level of consciousness: oriented and awake and alert Pain management: pain level controlled Vital Signs Assessment: post-procedure vital signs reviewed and stable Respiratory status: spontaneous breathing, respiratory function stable and patient connected to nasal cannula oxygen Cardiovascular status: blood pressure returned to baseline and stable Postop Assessment: no headache, no backache, no apparent nausea or vomiting and spinal receding Anesthetic complications: no    Last Vitals:  Vitals:   04/14/18 1354 04/14/18 1500  BP: 120/81 112/71  Pulse: 86 77  Resp: 16 16  Temp: 37.4 C 36.9 C  SpO2: 99%     Last Pain:  Vitals:   04/14/18 1500  TempSrc: Oral  PainSc: 0-No pain   Pain Goal:                 Farha Dano P Armonie Staten

## 2018-04-15 LAB — CBC
HEMATOCRIT: 29.2 % — AB (ref 36.0–46.0)
Hemoglobin: 9.9 g/dL — ABNORMAL LOW (ref 12.0–15.0)
MCH: 30.9 pg (ref 26.0–34.0)
MCHC: 33.9 g/dL (ref 30.0–36.0)
MCV: 91.3 fL (ref 78.0–100.0)
PLATELETS: 220 10*3/uL (ref 150–400)
RBC: 3.2 MIL/uL — ABNORMAL LOW (ref 3.87–5.11)
RDW: 12.6 % (ref 11.5–15.5)
WBC: 15.4 10*3/uL — AB (ref 4.0–10.5)

## 2018-04-15 LAB — CREATININE, SERUM
CREATININE: 0.51 mg/dL (ref 0.44–1.00)
GFR calc non Af Amer: >60 mL/min (ref >60)

## 2018-04-15 MED ORDER — FERROUS SULFATE 325 (65 FE) MG PO TABS
325.0000 mg | ORAL_TABLET | Freq: Two times a day (BID) | ORAL | Status: DC
  Administered 2018-04-15 – 2018-04-16 (×3): 325 mg via ORAL
  Filled 2018-04-15 (×3): qty 1

## 2018-04-15 MED ORDER — SODIUM CHLORIDE 0.9 % IV BOLUS: 500.0000 mL | Freq: Once | INTRAVENOUS | Status: DC

## 2018-04-15 MED ORDER — LACTATED RINGERS IV BOLUS
500.0000 mL | Freq: Once | INTRAVENOUS | Status: AC
  Administered 2018-04-15: 1000 mL via INTRAVENOUS

## 2018-04-15 NOTE — Lactation Note (Signed)
This note was copied from a baby's chart. Lactation Consultation Note Baby 80 hrs old. Mom stated baby hasn't been that hungry but starting to feed better. Had just eaten 45 min. Prior to [facility] consult. Mom awake. Mom has been sleeping last check on mom. Mom holding baby STS.  Mom has round breast w/small everted nipples. Compressible breast w/colostrum noted.  Hand pump given to mom to pre-pump prior to latching. Shells given, encouraged to wear this am. Discussed positioning and support during feeding. Reviewed STS, I&O, cluster feeding, supply and demand. DEBP taken to rm w/kit. RN asked to set up and teach mom. Baby wt. 5.11 lbs. Discussed limiting feeding times, to spare calories.  Discussed need of possible supplementing. Discussed giving colostrum, hand expression Encouraged mom to wake baby if hasn't cued in 3 hrs. And call for help or questions. Reported to RN.  Patient Name: Alicia Rich [id number] Date: 04/15/2018 Reason for consult: Follow-up assessment   Maternal Data    Feeding Feeding Type: Breast Fed  LATCH Score Latch: Repeated attempts needed to sustain latch, nipple held in mouth throughout feeding, stimulation needed to elicit sucking reflex.  Audible Swallowing: A few with stimulation  Type of Nipple: Everted at rest and after stimulation  Comfort (Breast/Nipple): Soft / non-tender(full feeling/compressible)  Hold (Positioning): Assistance needed to correctly position infant at breast and maintain latch.  LATCH Score: 7  Interventions Interventions: Breast feeding basics reviewed;Breast massage;Hand express;Pre-pump if needed;Shells;Hand pump;Breast compression;DEBP  Lactation Tools Discussed/Used Tools: Shells;Pump Shell Type: Inverted Breast pump type: Double-Electric Breast Pump;Manual Pump Review: Milk Storage(RN to set up) Date initiated:: 04/15/18   Consult Status Consult Status: Follow-up Date: 04/15/18 Follow-up type:  In-patient    Theodoro Kalata 04/15/2018, 5:32 AM

## 2018-04-15 NOTE — Progress Notes (Signed)
POSTPARTUM PROGRESS NOTE  Post Partum Day 1 Subjective:  Kaeli Nichelson is a 22 y.o. G1P0000 [redacted]w[redacted]d s/p pLTCS for breech presentation.  No acute events overnight.  Pt denies problems with ambulating, voiding or po intake.  She denies nausea or vomiting.  Pain is well controlled.  She has had a lot of flatus. She has not had bowel movement.  Lochia Minimal.   Objective: Blood pressure 99/62, pulse 81, temperature 98.3 F (36.8 C), temperature source Oral, resp. rate 17, height 5\' 1"  (1.549 m), weight 63.8 kg, last menstrual period 07/15/2017, SpO2 98 %.  Physical Exam:  General: alert, cooperative and no distress Lochia:normal flow Chest: CTAB Heart: RRR no m/r/g Abdomen: +BS, soft, nontender  Incision: 50% saturated with old blood Uterine Fundus: firm DVT Evaluation: No calf swelling or tenderness Extremities: no LE edema b/l  Recent Labs    04/13/18 0930 04/15/18 0603  HGB 12.4 9.9*  HCT 36.9 29.2*    Assessment/Plan:  ASSESSMENT: Vergia Chea is a 22 y.o. G1P0000 [redacted]w[redacted]d s/p s/p pLTCS of a baby girl. EBL: POD #1.  #Breast & Bottle feeding #Contraception: OCPs #Circ: N/A (girl) #Hgb 9.9 from 12.4, asymptomatic, will start iron #BP have been low. 99/62 after bolus #Discharge on POD#2 or #3   LOS: 1 day   Basilio Cairo, Medical Student 04/15/2018, 7:51 AM

## 2018-04-15 NOTE — Progress Notes (Signed)
RN called Dr. Darin Engels to notify her of BP 89/48. Received orders for 500 ml bolus and to recheck manual BP after. Will continue to monitor.

## 2018-04-15 NOTE — Progress Notes (Addendum)
RN called Dr. Darin Engels to inform about patient's blood pressures running low, with the 0030 BP reading as 99/52 on the Dinamap. RN told to check a manual and call back. Manual BP was 100/52, pt. asymptomatic. Dr. Darin Engels advised RN to continue the current bag of fluids until empty and continue to monitor patient for any changes.

## 2018-04-16 MED ORDER — OXYCODONE HCL 5 MG PO TABS: 5.0000 mg | ORAL_TABLET | ORAL | 0 refills | Status: DC | PRN

## 2018-04-16 MED ORDER — IBUPROFEN 600 MG PO TABS: 600.0000 mg | ORAL_TABLET | Freq: Four times a day (QID) | ORAL | 0 refills | Status: DC

## 2018-04-16 NOTE — Progress Notes (Signed)
CSW received and acknowledges consult for EDPS of 9.  Consult screened out due to 9 on EDPS does not warrant a CSW consult.  MOB whom scores are greater than 9/yes to question 10 on [place] Postpartum Depression Screen warrants a CSW consult.   Alicia Rich, MSW, LCSW Clinical Social Work [phone]  

## 2018-04-16 NOTE — Lactation Note (Signed)
This note was copied from a baby's chart. Lactation Consultation Note  Patient Name: Alicia Rich [id number] Date: 04/16/2018 Reason for consult: Follow-up assessment;Infant weight loss  Baby is 48 hours old  Per mom wants to go home early and the baby's doctor mention there  Was a possibility this evening.  Per mom baby last fed at 1100 for 10  mins , and supplemented at 1210 with 20 ml finger feeding.  Baby awake and hungry, LC offered to assist to latch , and mom receptive, diaper dry/  Latch on the left breast football/ multiple swallows noted due to moms milk coming to  Volume, and increased with breast compressions.  Baby fed for 12 mins and released/ nipple well rounded/ both breast soften.  LC reviewed the DEBP and also showed dad how to PACE feed , and they both did  Well.  Due to 9 % weight loss recommended until the baby is over 6 pounds and is gaining  Feed the baby 1st breast for 15 -20 mins, supplement PACE feeding ( Dad ) and  Mom post pump both breast for 15 -20 misn and save milk for next feeding.  Per dad order the DEBP from insurance company and it should be at home.  LC recommended if it isn't after talking to his mom to rent a DEBP from the gift  Shop until the DEBP comes in the mail.   Sore nipple and engorgement prevention and tx reviewed.  Mom already had shells and wearing them between feedings.  LC reminded to use shells only when awake not sleeping.  Mom and dad aware if breast milk is not available for supplementing to  Supplement with formula.   Mom and dad receptive to breast feeding teaching and asked appropriate questions.  Mother informed of post-discharge support and given phone number to the lactation department, including services for phone call assistance; out-patient appointments; and breastfeeding support group. List of other breastfeeding resources in the community given in the handout. Encouraged mother to call for problems or concerns  related to breastfeeding.  Mom and dad receptive to return for [facility] OP appt. In about 1 week and were made aware The clinic would call them for appt.     Maternal Data    Feeding Feeding Type: Formula Nipple Type: Slow - flow  LATCH Score ( Latch Score by the [facility] )  Latch: Grasps breast easily, tongue down, lips flanged, rhythmical sucking.  Audible Swallowing: Spontaneous and intermittent  Type of Nipple: Everted at rest and after stimulation  Comfort (Breast/Nipple): Filling, red/small blisters or bruises, mild/mod discomfort  Hold (Positioning): Assistance needed to correctly position infant at breast and maintain latch.  LATCH Score: 8  Interventions Interventions: Breast feeding basics reviewed  Lactation Tools Discussed/Used     Consult Status Consult Status: Follow-up Date: (LC requested in the [facility] clinic Epic basket ) Follow-up type: Out-patient    Matilde Sprang Casee Knepp 04/16/2018, 2:56 PM

## 2018-04-16 NOTE — Progress Notes (Signed)
POSTPARTUM PROGRESS NOTE  Post Op Day 2  Subjective:  Alicia Rich is a 22 y.o. G1P0000 s/p C/s at [redacted]w[redacted]d.  She reports she is doing well. No acute events overnight. She denies any problems with ambulating, voiding or po intake. Denies nausea or vomiting.  Pain is well controlled.  Lochia is mild. Having trouble with milk supply   Objective: Blood pressure 116/77, pulse 73, temperature 98.1 F (36.7 C), temperature source Oral, resp. rate 17, height 5\' 1"  (1.549 m), weight 63.8 kg, last menstrual period 07/15/2017, SpO2 99 %.  Physical Exam:  General: alert, cooperative and no distress Chest: no respiratory distress Heart:regular rate, distal pulses intact Abdomen: soft, nontender, incision site clean/dry, no drainage or erythema, honeycomb dressing intact without significant drainage Uterine Fundus: firm, appropriately tender DVT Evaluation: No calf swelling or tenderness Extremities: no edema Skin: warm, dry  Recent Labs    04/15/18 0603  HGB 9.9*  HCT 29.2*    Assessment/Plan: Alicia Rich is a 22 y.o. G1P0000 s/p c/s at [redacted]w[redacted]d   POD#2 - Doing well  Routine postpartum care Contraception: POPs Feeding: breast  Dispo: Plan for discharge on POD #3.   LOS: 2 days   Oralia Manis, DO PGY-2 04/16/2018, 11:04 AM

## 2018-04-16 NOTE — Discharge Instructions (Signed)

## 2018-04-16 NOTE — Discharge Summary (Signed)
Postpartum Discharge Summary     Patient Name: Alicia Rich DOB: [birth year] MRN: [id number]  Date of admission: 04/14/2018 Delivering Provider: Adam Phenix   Date of discharge: 04/16/2018  Admitting diagnosis: Breech, failed version Intrauterine pregnancy: [redacted]w[redacted]d     Secondary diagnosis:  Active Problems:   Breech presentation   Status post repeat low transverse cesarean section  Additional problems: none      Discharge diagnosis: Term Pregnancy Delivered                                                                                                Post partum procedures:none  Augmentation: none  Complications: None  Hospital course:  Sceduled C/S   22 y.o. yo G1P0000 at [redacted]w[redacted]d was admitted to the hospital 04/14/2018 for scheduled cesarean section with the following indication:Malpresentation.  Membrane Rupture Time/Date: 11:47 AM ,04/14/2018   Patient delivered a Viable infant.04/14/2018  Details of operation can be found in separate operative note.  Pateint had an uncomplicated postpartum course.  She is ambulating, tolerating a regular diet, passing flatus, and urinating well. Patient is discharged home in stable condition on  04/16/18         Magnesium Sulfate recieved: No BMZ received: No  Physical exam  Vitals:   04/15/18 1100 04/15/18 1500 04/15/18 2125 04/16/18 0500  BP: (!) 105/54 (!) 93/51 112/62 116/77  Pulse: 82 75 79 73  Resp: 18  18 17   Temp: 98.5 F (36.9 C) 98.7 F (37.1 C) 98.1 F (36.7 C)   TempSrc: Oral Oral Oral   SpO2: 99% 99%  99%  Weight:      Height:       General: alert, cooperative and no distress Lochia: appropriate Uterine Fundus: firm Incision: Healing well with no significant drainage DVT Evaluation: No evidence of DVT seen on physical exam. Labs: Lab Results  Component Value Date   WBC 15.4 (H) 04/15/2018   HGB 9.9 (L) 04/15/2018   HCT 29.2 (L) 04/15/2018   MCV 91.3 04/15/2018   PLT 220 04/15/2018   CMP Latest Ref  Rng & Units 04/15/2018  Glucose 70 - 99 mg/dL -  BUN 6 - 23 mg/dL -  Creatinine 4.09 - 8.11 mg/dL 9.14  Sodium 782 - 956 mEq/L -  Potassium 3.5 - 5.1 mEq/L -  Chloride 96 - 112 mEq/L -  CO2 19 - 32 mEq/L -  Calcium 8.4 - 10.5 mg/dL -  Total Protein 6.0 - 8.3 g/dL -  Total Bilirubin 0.2 - 1.2 mg/dL -  Alkaline Phos 47 - 213 U/L -  AST 0 - 37 U/L -  ALT 0 - 35 U/L -    Discharge instruction: per After Visit Summary and "Baby and Me Booklet".  After visit meds:  Allergies as of 04/16/2018   No Known Allergies     Medication List    TAKE these medications   ferrous sulfate 325 (65 FE) MG tablet Take 1 tablet (325 mg total) by mouth daily with breakfast.   FIBER ADULT GUMMIES 2 g Chew Chew 2 tablets by mouth daily.   ibuprofen  600 MG tablet Commonly known as:  ADVIL,MOTRIN Take 1 tablet (600 mg total) by mouth every 6 (six) hours.   oxyCODONE 5 MG immediate release tablet Commonly known as:  Oxy IR/ROXICODONE Take 1 tablet (5 mg total) by mouth every 4 (four) hours as needed (pain scale 4-7).   prenatal multivitamin Tabs tablet Take 1 tablet by mouth daily.       Diet: routine diet  Activity: Advance as tolerated. Pelvic rest for 6 weeks.   Outpatient follow up:1 week Follow up Appt: Future Appointments  Date Time Provider Department Center  04/23/2018 10:00 AM [facility] NURSE [facility]  05/08/2018 11:00 AM Reva Bores, MD [facility]   Follow up Visit:No follow-ups on file.   Please schedule this patient for Postpartum visit in: 1 week with the following provider: Any provider For C/S patients schedule nurse incision check in weeks 2 weeks: yes Low risk pregnancy complicated by: none Delivery mode:  CS Anticipated Birth Control:  OCPs PP Procedures needed: none  Schedule Integrated BH visit: no      Newborn Data: Live born female  Birth Weight: 6 lb 1 oz (2750 g) APGAR: 8, 9  Newborn Delivery   Birth date/time:  04/14/2018  11:49:00 Delivery type:  C-Section, Low Transverse Trial of labor:  No C-section categorization:  Primary     Baby Feeding: Bottle and Breast Disposition:home with mother   04/16/2018 Wynelle Bourgeois, CNM

## 2018-04-16 NOTE — Lactation Note (Signed)
This note was copied from a baby's chart. Lactation Consultation Note Baby 55 hrs old. Baby had wt. Loss 6 % less than 24 hrs old. Baby wt. 5.11lbs. Discussed w/mom supplementing. Mom in agreement.   Assessed mom's breast. Breast are round and full feeling. Short shaft everted nipples evert more w/reverse pressure and hand expression. Rt. Is more everted than Lt. Hand expressed 3 ml colostrum. Latched baby in football hold w/compressing breast to sand witch hold. Baby latched well. Heard swallows. Noted significant softening of breast. Mom excited. Discussed engorgement, filling, breast massage, hand expression, supplementing, supply and demand. Supplemented baby colostrum w/curve tip syring, then gave formula to equal amount needed for hours of age. Mom states understanding of supplementation. Gave Similac 20 cal. Encouraged mom to wear shells between feedings. Mom applied shells. Encouraged to call  For assistance or questions. Mom stated she would call for RN to watch her do the next supplement then she thought she would be ok.   Patient Name: Alicia Rich [id number] Date: 04/16/2018 Reason for consult: Follow-up assessment;Infant weight loss;Infant < 6lbs;1st time breastfeeding   Maternal Data    Feeding Feeding Type: Formula  LATCH Score Latch: Grasps breast easily, tongue down, lips flanged, rhythmical sucking.  Audible Swallowing: Spontaneous and intermittent  Type of Nipple: Everted at rest and after stimulation  Comfort (Breast/Nipple): Filling, red/small blisters or bruises, mild/mod discomfort  Hold (Positioning): Assistance needed to correctly position infant at breast and maintain latch.  LATCH Score: 8  Interventions Interventions: Adjust position;DEBP;Assisted with latch;Support pillows;Skin to skin;Position options;Breast massage;Expressed milk;Hand express;Pre-pump if needed;Shells;Reverse pressure;Breast compression;Breast feeding basics  reviewed  Lactation Tools Discussed/Used Tools: Shells;Pump;Coconut oil Shell Type: Inverted Breast pump type: Double-Electric Breast Pump Initiated by:: RN   Consult Status Consult Status: Follow-up Date: 04/17/18 Follow-up type: In-patient    Maddilyn Campus, Diamond Nickel 04/16/2018, 3:10 AM

## 2018-04-23 ENCOUNTER — Ambulatory Visit: Payer: Self-pay

## 2018-04-25 ENCOUNTER — Ambulatory Visit: Payer: BLUE CROSS/BLUE SHIELD | Admitting: *Deleted

## 2018-04-25 VITALS — BP 114/78 | HR 91

## 2018-04-25 DIAGNOSIS — Z5189 Encounter for other specified aftercare: Secondary | ICD-10-CM

## 2018-04-25 NOTE — Progress Notes (Addendum)
Patient here for an incision check. Pt had cesarean section on 10/5. Incision was clean, intact, no redness or swelling. Pt denies any problems.   Scheryl Marten RN   ATTESTATION OF SUPERVISION OF RN: Evaluation and management procedures were performed by the RN under my supervision and collaboration. I have reviewed the nursing note and chart and agree with the management and plan for this patient.  Clayton Bibles, CNM

## 2018-05-08 ENCOUNTER — Encounter: Payer: Self-pay | Admitting: Family Medicine

## 2018-05-08 ENCOUNTER — Ambulatory Visit (INDEPENDENT_AMBULATORY_CARE_PROVIDER_SITE_OTHER): Payer: BLUE CROSS/BLUE SHIELD | Admitting: Family Medicine

## 2018-05-08 VITALS — BP 111/72 | HR 80 | Wt 124.0 lb

## 2018-05-08 DIAGNOSIS — Z3041 Encounter for surveillance of contraceptive pills: Secondary | ICD-10-CM

## 2018-05-08 MED ORDER — NORGESTIMATE-ETH ESTRADIOL 0.25-35 MG-MCG PO TABS: 1.0000 | ORAL_TABLET | Freq: Every day | ORAL | 3 refills | Status: DC

## 2018-05-08 NOTE — Progress Notes (Signed)
Post Partum Exam  Alicia Rich is a 22 y.o. G78P0000 female who presents for a postpartum visit. She is 3 weeks postpartum following a low cervical transverse Cesarean section. I have fully reviewed the prenatal and intrapartum course. The delivery was at 39.2 gestational weeks.  Anesthesia: spinal. Postpartum course has been uncomplicated. Baby's course has been uncomplicated Baby is feeding by breast and bottle-Gerber Soothe Pro. Bleeding staining only. Bowel function is normal. Bladder function is normal. Patient is not sexually active. Contraception method is OCP (estrogen/progesterone). Postpartum depression screening:neg I have reviewed and verified this information   Last pap smear done 07/26/17 and was Normal  Review of Systems Pertinent items noted in HPI and remainder of comprehensive ROS otherwise negative.    Objective:  Blood pressure 111/72, pulse 80, weight 124 lb (56.2 kg), last menstrual period 07/15/2017, unknown if currently breastfeeding.  General:  alert, cooperative and appears stated age  Lungs: normal effort  Heart:  regular rate and rhythm  Abdomen: soft, non-tender; bowel sounds normal; no masses,  no organomegaly incision is well healed        Assessment:    Nml postpartum exam. Pap smear not done at today's visit.   Plan:   1. Contraception: OCP (estrogen/progesterone) begin at 6 wks pp on 11/17 2. No heavy lifting > 20 lbs, no driving if needs narcotic pain meds 3. Follow up in: 3 months for CPE or as needed.

## 2018-05-23 ENCOUNTER — Encounter: Payer: Self-pay | Admitting: Radiology

## 2018-06-05 ENCOUNTER — Encounter: Payer: Self-pay | Admitting: Radiology

## 2018-06-12 ENCOUNTER — Encounter: Payer: Self-pay | Admitting: Obstetrics & Gynecology

## 2018-06-12 ENCOUNTER — Ambulatory Visit (INDEPENDENT_AMBULATORY_CARE_PROVIDER_SITE_OTHER): Payer: BLUE CROSS/BLUE SHIELD | Admitting: Obstetrics & Gynecology

## 2018-06-12 VITALS — BP 109/76 | HR 81 | Wt 123.0 lb

## 2018-06-12 DIAGNOSIS — R102 Pelvic and perineal pain: Secondary | ICD-10-CM | POA: Diagnosis not present

## 2018-06-12 DIAGNOSIS — Z3202 Encounter for pregnancy test, result negative: Secondary | ICD-10-CM

## 2018-06-12 DIAGNOSIS — Z98891 History of uterine scar from previous surgery: Secondary | ICD-10-CM

## 2018-06-12 LAB — POCT URINALYSIS DIPSTICK: LEUKOCYTES UA: NEGATIVE

## 2018-06-12 LAB — POCT URINE PREGNANCY: PREG TEST UR: NEGATIVE

## 2018-06-12 MED ORDER — SULFAMETHOXAZOLE-TRIMETHOPRIM 800-160 MG PO TABS: 1.0000 | ORAL_TABLET | Freq: Two times a day (BID) | ORAL | 0 refills | Status: AC

## 2018-06-12 MED ORDER — CYCLOBENZAPRINE HCL 10 MG PO TABS: 10.0000 mg | ORAL_TABLET | Freq: Three times a day (TID) | ORAL | 1 refills | Status: DC | PRN

## 2018-06-12 MED ORDER — IBUPROFEN 600 MG PO TABS: 600.0000 mg | ORAL_TABLET | Freq: Four times a day (QID) | ORAL | 1 refills | Status: DC

## 2018-06-12 NOTE — Progress Notes (Signed)
GYNECOLOGY OFFICE VISIT NOTE  History:  22 y.o. G1P1001 here today for evaluation of acute onset of pain in her cesarean incision and around the area, which radiates to her back x 1 week. Not taking any pain medication, but reports pain keeps her up, crying at night. Can get very severe at times. Cannot tell if it is getting better as week progressed, but not getting worse. No fevers, dysuria, N/V, change in bowel habits.  She denies any abnormal vaginal discharge, bleeding or other concerns.   Past Medical History:  Diagnosis Date  . Medical history non-contributory     Past Surgical History:  Procedure Laterality Date  . CESAREAN SECTION N/A 04/14/2018   Procedure: CESAREAN SECTION;  Surgeon: Adam Phenix, MD;  Location: [facility] BIRTHING SUITES;  Service: Obstetrics;  Laterality: N/A;  . NO PAST SURGERIES      The following portions of the patient's history were reviewed and updated as appropriate: allergies, current medications, past family history, past medical history, past social history, past surgical history and problem list.   Health Maintenance:  Normal pap and negative HRHPV on 07/26/2017.   Review of Systems:  Pertinent items noted in HPI and remainder of comprehensive ROS otherwise negative.  Objective:  Physical Exam BP 109/76   Pulse 81   Wt 123 lb (55.8 kg)   BMI 23.24 kg/m  CONSTITUTIONAL: Well-developed, well-nourished female in no acute distress.  HEENT:  Normocephalic, atraumatic. External right and left ear normal. No scleral icterus.  NECK: Normal range of motion, supple, no masses noted on observation SKIN: No rash noted. Not diaphoretic. No erythema. No pallor. MUSCULOSKELETAL: Normal range of motion. No edema noted. NEUROLOGIC: Alert and oriented to person, place, and time. Normal muscle tone coordination. No cranial nerve deficit noted. PSYCHIATRIC: Normal mood and affect. Normal behavior. Normal judgment and thought content. CARDIOVASCULAR: Normal  heart rate noted RESPIRATORY: Effort and breath sounds normal, no problems with respiration noted ABDOMEN/BACK: Incision is well healed, no erythema, induration. No subcutaneous masses felt. Moderate suprapubic and diffuse lower abdominal pain on palpation, no rebound or guarding. More pain on left side compared to right side. Left flank pain appreciated, no left CVAT. No R CVAT.  No masses noted. No other overt distention noted.   PELVIC: Deferred  Labs and Imaging Results for orders placed or performed in visit on 06/12/18 (from the past 168 hour(s))  POCT Urinalysis Dipstick   Collection Time: 06/12/18  3:28 PM  Result Value Ref Range   Color, UA     Clarity, UA     Glucose, UA     Bilirubin, UA     Ketones, UA     Spec Grav, UA     Blood, UA moderate    pH, UA     Protein, UA     Urobilinogen, UA     Nitrite, UA     Leukocytes, UA Negative Negative   Appearance     Odor    POCT urine pregnancy   Collection Time: 06/12/18  3:37 PM  Result Value Ref Range   Preg Test, Ur Negative Negative   No results found.  Assessment & Plan:  1. Abdominopelvic pain 2. Status post repeat low transverse cesarean section Unsure about etiology of pain. There is moderate pain in UA (UTI vs stone?). Culture sent, will presumptively treat for UTI for now. Ibuprofen and Flexeril ordered for pain.  If pain remains severe, will need imaging for evaluation of possible postop abscess  or other concerns. - POCT Urinalysis Dipstick - POCT urine pregnancy - Urine Culture - CT ABDOMEN PELVIS W CONTRAST; Future - sulfamethoxazole-trimethoprim (BACTRIM DS,SEPTRA DS) 800-160 MG tablet; Take 1 tablet by mouth 2 (two) times daily for 3 days.  Dispense: 6 tablet; Refill: 0 - ibuprofen (ADVIL,MOTRIN) 600 MG tablet; Take 1 tablet (600 mg total) by mouth every 6 (six) hours.  Dispense: 30 tablet; Refill: 1 - cyclobenzaprine (FLEXERIL) 10 MG tablet; Take 1 tablet (10 mg total) by mouth every 8 (eight) hours as  needed for muscle spasms.  Dispense: 30 tablet; Refill: 1  Return if symptoms worsen or fail to improve.  Total face-to-face time with patient: 25 minutes.  Over 50% of encounter was spent on counseling and coordination of care.   Jaynie CollinsUGONNA  , MD, FACOG Obstetrician & Gynecologist, [facility], [facility]

## 2018-06-12 NOTE — Patient Instructions (Signed)
Return to clinic for any scheduled appointments or for any gynecologic concerns as needed.   

## 2018-06-13 LAB — URINE CULTURE: ORGANISM ID, BACTERIA: NO GROWTH

## 2018-06-18 ENCOUNTER — Telehealth: Payer: Self-pay | Admitting: Radiology

## 2018-06-18 NOTE — Telephone Encounter (Signed)
Patient called stating that [facility], stated that she took antibiotic and is feeling much better, and is back to work.

## 2018-06-20 ENCOUNTER — Ambulatory Visit (HOSPITAL_COMMUNITY): Admission: RE | Admit: 2018-06-20 | Payer: BLUE CROSS/BLUE SHIELD | Source: Ambulatory Visit

## 2018-07-18 ENCOUNTER — Ambulatory Visit (INDEPENDENT_AMBULATORY_CARE_PROVIDER_SITE_OTHER): Payer: BLUE CROSS/BLUE SHIELD | Admitting: Advanced Practice Midwife

## 2018-07-18 ENCOUNTER — Other Ambulatory Visit (HOSPITAL_COMMUNITY)
Admission: RE | Admit: 2018-07-18 | Discharge: 2018-07-18 | Disposition: A | Discharge status: Home / Self Care | Payer: BLUE CROSS/BLUE SHIELD | Source: Ambulatory Visit | Attending: Advanced Practice Midwife | Admitting: Advanced Practice Midwife

## 2018-07-18 ENCOUNTER — Encounter: Payer: Self-pay | Admitting: Advanced Practice Midwife

## 2018-07-18 VITALS — BP 116/73 | HR 72 | Wt 121.6 lb

## 2018-07-18 DIAGNOSIS — Z01419 Encounter for gynecological examination (general) (routine) without abnormal findings: Secondary | ICD-10-CM

## 2018-07-18 DIAGNOSIS — Z23 Encounter for immunization: Secondary | ICD-10-CM

## 2018-07-18 NOTE — Progress Notes (Signed)
GYNECOLOGY ANNUAL PREVENTATIVE CARE ENCOUNTER NOTE  Subjective:   Alicia Rich is a 23 y.o. G78P1001 female here for a routine annual gynecologic exam.  Current complaints: patient is concerned about occasional discomfort along her cesarean section scar. She is s/p PLTCS for breech presentation 04/14/18. She is unsure of what is normal for c section scars at this stage of recovery and would like confirmation that everything is normal. She denies abnormal vaginal bleeding, discharge, pelvic pain, problems with intercourse or other gynecologic concerns.     Gynecologic History No LMP recorded. Contraception: OCP (estrogen/progesterone) Last Pap: 07/26/2017. Results were: normal with negative HPV Last mammogram: N/A age 49.   Obstetric History OB History  Gravida Para Term Preterm AB Living  1 1 1  0 0 1  SAB TAB Ectopic Multiple Live Births  0 0 0 0 1    # Outcome Date GA Lbr Len/2nd Weight Sex Delivery Anes PTL Lv  1 Term 04/14/18 [redacted]w[redacted]d  6 lb 1 oz (2.75 kg) F CS-LTranv  N LIV     Complications: Breech presentation, antepartum    Past Medical History:  Diagnosis Date  . Medical history non-contributory     Past Surgical History:  Procedure Laterality Date  . CESAREAN SECTION N/A 04/14/2018   Procedure: CESAREAN SECTION;  Surgeon: Adam Phenix, MD;  Location: [facility] BIRTHING SUITES;  Service: Obstetrics;  Laterality: N/A;  . NO PAST SURGERIES      Current Outpatient Medications on File Prior to Visit  Medication Sig Dispense Refill  . norgestimate-ethinyl estradiol (ORTHO-CYCLEN,SPRINTEC,PREVIFEM) 0.25-35 MG-MCG tablet Take 1 tablet by mouth daily. 3 Package 3  . cyclobenzaprine (FLEXERIL) 10 MG tablet Take 1 tablet (10 mg total) by mouth every 8 (eight) hours as needed for muscle spasms. (Patient not taking: Reported on 07/18/2018) 30 tablet 1  . ferrous sulfate 325 (65 FE) MG tablet Take 1 tablet (325 mg total) by mouth daily with breakfast. (Patient not taking: Reported on  07/18/2018) 30 tablet 3  . FIBER ADULT GUMMIES 2 g CHEW Chew 2 tablets by mouth daily.     Marland Kitchen ibuprofen (ADVIL,MOTRIN) 600 MG tablet Take 1 tablet (600 mg total) by mouth every 6 (six) hours. (Patient not taking: Reported on 07/18/2018) 30 tablet 1  . oxyCODONE (OXY IR/ROXICODONE) 5 MG immediate release tablet Take 1 tablet (5 mg total) by mouth every 4 (four) hours as needed (pain scale 4-7). (Patient not taking: Reported on 06/12/2018) 20 tablet 0  . Prenatal Vit-Fe Fumarate-FA (PRENATAL MULTIVITAMIN) TABS tablet Take 1 tablet by mouth daily.      No current facility-administered medications on file prior to visit.     No Known Allergies  Social History:  reports that she has never smoked. She has never used smokeless tobacco. She reports that she does not drink alcohol or use drugs.  Family History  Problem Relation Age of Onset  . Heart attack Maternal Grandfather   . Heart disease Maternal Grandfather   . Heart disease Paternal Grandfather   . Stroke Paternal Grandfather     The following portions of the patient's history were reviewed and updated as appropriate: allergies, current medications, past family history, past medical history, past social history, past surgical history and problem list.  Review of Systems Pertinent items noted in HPI and remainder of comprehensive ROS otherwise negative.   Objective:  BP 116/73   Pulse 72   Wt 121 lb 9.6 oz (55.2 kg)   BMI 22.98 kg/m  CONSTITUTIONAL: Well-developed,  well-nourished female in no acute distress.  HENT:  Normocephalic, atraumatic, External right and left ear normal. Oropharynx is clear and moist EYES: Conjunctivae and EOM are normal. Pupils are equal, round, and reactive to light. No scleral icterus.  NECK: Normal range of motion, supple, no masses.  Normal thyroid.  SKIN: Skin is warm and dry. No rash noted. Not diaphoretic. No erythema. No pallor. MUSCULOSKELETAL: Normal range of motion. No tenderness.  No cyanosis,  clubbing, or edema.  2+ distal pulses. NEUROLOGIC: Alert and oriented to person, place, and time. Normal reflexes, muscle tone coordination. No cranial nerve deficit noted. PSYCHIATRIC: Normal mood and affect. Normal behavior. Normal judgment and thought content. CARDIOVASCULAR: Normal heart rate noted, regular rhythm RESPIRATORY: Clear to auscultation bilaterally. Effort and breath sounds normal, no problems with respiration noted. BREASTS: Symmetric in size. No masses, skin changes, nipple drainage, or lymphadenopathy. ABDOMEN: Soft, normal bowel sounds, no distention noted.  No tenderness, rebound or guarding.  PELVIC: Normal appearing external genitalia; normal appearing vaginal mucosa and cervix.  No abnormal discharge noted.  Pap smear obtained.  Normal uterine size, no other palpable masses, no uterine or adnexal tenderness.    Assessment and Plan:  1. Well woman exam with routine gynecological exam - Assured c section scar is well-healed. Patient to start gentle massage with Vitamin E to desensitize - No concerning findings - Cytology - PAP( [facility])  Will follow up results of pap smear and manage accordingly. Routine preventative health maintenance measures emphasized. Please refer to After Visit Summary for other counseling recommendations.    Clayton Bibles, CNM [employer] for [employer], [facility]

## 2018-07-18 NOTE — Progress Notes (Signed)
Pap Discuss gardisil  C-section pain for a couple months.

## 2018-07-19 LAB — CYTOLOGY - PAP: Diagnosis: NEGATIVE

## 2018-08-15 ENCOUNTER — Ambulatory Visit: Payer: Self-pay

## 2018-08-22 ENCOUNTER — Ambulatory Visit: Payer: BLUE CROSS/BLUE SHIELD

## 2018-08-29 ENCOUNTER — Encounter: Payer: Self-pay | Admitting: Advanced Practice Midwife

## 2018-08-29 ENCOUNTER — Ambulatory Visit (INDEPENDENT_AMBULATORY_CARE_PROVIDER_SITE_OTHER): Payer: BLUE CROSS/BLUE SHIELD | Admitting: Advanced Practice Midwife

## 2018-08-29 VITALS — BP 111/74 | HR 94 | Wt 117.8 lb

## 2018-08-29 DIAGNOSIS — E639 Nutritional deficiency, unspecified: Secondary | ICD-10-CM

## 2018-08-29 NOTE — Patient Instructions (Signed)
Preventive Care 18-39 Years, Female Preventive care refers to lifestyle choices and visits with your health care provider that can promote health and wellness. What does preventive care include?   A yearly physical exam. This is also called an annual well check.  Dental exams once or twice a year.  Routine eye exams. Ask your health care provider how often you should have your eyes checked.  Personal lifestyle choices, including: ? Daily care of your teeth and gums. ? Regular physical activity. ? Eating a healthy diet. ? Avoiding tobacco and drug use. ? Limiting alcohol use. ? Practicing safe sex. ? Taking vitamin and mineral supplements as recommended by your health care provider. What happens during an annual well check? The services and screenings done by your health care provider during your annual well check will depend on your age, overall health, lifestyle risk factors, and family history of disease. Counseling Your health care provider may ask you questions about your:  Alcohol use.  Tobacco use.  Drug use.  Emotional well-being.  Home and relationship well-being.  Sexual activity.  Eating habits.  Work and work environment.  Method of birth control.  Menstrual cycle.  Pregnancy history. Screening You may have the following tests or measurements:  Height, weight, and BMI.  Diabetes screening. This is done by checking your blood sugar (glucose) after you have not eaten for a while (fasting).  Blood pressure.  Lipid and cholesterol levels. These may be checked every 5 years starting at age 20.  Skin check.  Hepatitis C blood test.  Hepatitis B blood test.  Sexually transmitted disease (STD) testing.  BRCA-related cancer screening. This may be done if you have a family history of breast, ovarian, tubal, or peritoneal cancers.  Pelvic exam and Pap test. This may be done every 3 years starting at age 21. Starting at age 30, this may be done every 5  years if you have a Pap test in combination with an HPV test. Discuss your test results, treatment options, and if necessary, the need for more tests with your health care provider. Vaccines Your health care provider may recommend certain vaccines, such as:  Influenza vaccine. This is recommended every year.  Tetanus, diphtheria, and acellular pertussis (Tdap, Td) vaccine. You may need a Td booster every 10 years.  Varicella vaccine. You may need this if you have not been vaccinated.  HPV vaccine. If you are 26 or younger, you may need three doses over 6 months.  Measles, mumps, and rubella (MMR) vaccine. You may need at least one dose of MMR. You may also need a second dose.  Pneumococcal 13-valent conjugate (PCV13) vaccine. You may need this if you have certain conditions and were not previously vaccinated.  Pneumococcal polysaccharide (PPSV23) vaccine. You may need one or two doses if you smoke cigarettes or if you have certain conditions.  Meningococcal vaccine. One dose is recommended if you are age 19-21 years and a first-year college student living in a residence hall, or if you have one of several medical conditions. You may also need additional booster doses.  Hepatitis A vaccine. You may need this if you have certain conditions or if you travel or work in places where you may be exposed to hepatitis A.  Hepatitis B vaccine. You may need this if you have certain conditions or if you travel or work in places where you may be exposed to hepatitis B.  Haemophilus influenzae type b (Hib) vaccine. You may need this if you   have certain risk factors. Talk to your health care provider about which screenings and vaccines you need and how often you need them. This information is not intended to replace advice given to you by your health care provider. Make sure you discuss any questions you have with your health care provider. Document Released: 08/23/2001 Document Revised: 02/07/2017  Document Reviewed: 04/28/2015 Elsevier Interactive Patient Education  2019 [employer].

## 2018-08-29 NOTE — Progress Notes (Signed)
GYNECOLOGY PROBLEM ENCOUNTER NOTE  Subjective:   Alicia Rich is a 23 y.o. G27P1001 female here with chief complaint of incision pain. She is s/p cesarean section for breech presentation 04/14/2018. Denies abnormal vaginal bleeding, discharge, pelvic pain, problems with intercourse or other gynecologic concerns.   Patient endorses intermittent abdominal pain in hew lower abdomen. Her pain can reach a level of 10/10 and almost always occurs just before bedtime. It  resolves without intervention for as much as one month at a time then recurs. She has not tried medication or other treatments for this pain.  Patient endorses nausea and vomiting throughout the week but denies other symptoms. This is a new problem, onset about one month ago.  She has daily nausea and says she "only vomits when I force myself to eat because I'm not in the mood to eat the food in front of me".   Patient states she eats waffles and pancakes for breakfast, does not eat lunch, then has pork chops or pizza for dinner. She consumes an average of two cups of water during the day. She endorses bowel movements two times per week. She reports "the only vegetable I eat is corn".    Gynecologic History No LMP recorded. Contraception: OCP (estrogen/progesterone) Last Pap:  07/18/2018. Results were: normal with negative HPV  Obstetric History OB History  Gravida Para Term Preterm AB Living  1 1 1  0 0 1  SAB TAB Ectopic Multiple Live Births  0 0 0 0 1    # Outcome Date GA Lbr Len/2nd Weight Sex Delivery Anes PTL Lv  1 Term 04/14/18 [redacted]w[redacted]d  6 lb 1 oz (2.75 kg) F CS-LTranv  N LIV     Complications: Breech presentation, antepartum    Past Medical History:  Diagnosis Date  . Medical history non-contributory     Past Surgical History:  Procedure Laterality Date  . CESAREAN SECTION N/A 04/14/2018   Procedure: CESAREAN SECTION;  Surgeon: Adam Phenix, MD;  Location: [facility] BIRTHING SUITES;  Service: Obstetrics;   Laterality: N/A;  . NO PAST SURGERIES      Current Outpatient Medications on File Prior to Visit  Medication Sig Dispense Refill  . norgestimate-ethinyl estradiol (ORTHO-CYCLEN,SPRINTEC,PREVIFEM) 0.25-35 MG-MCG tablet Take 1 tablet by mouth daily. 3 Package 3  . cyclobenzaprine (FLEXERIL) 10 MG tablet Take 1 tablet (10 mg total) by mouth every 8 (eight) hours as needed for muscle spasms. (Patient not taking: Reported on 07/18/2018) 30 tablet 1  . ferrous sulfate 325 (65 FE) MG tablet Take 1 tablet (325 mg total) by mouth daily with breakfast. (Patient not taking: Reported on 07/18/2018) 30 tablet 3  . ibuprofen (ADVIL,MOTRIN) 600 MG tablet Take 1 tablet (600 mg total) by mouth every 6 (six) hours. (Patient not taking: Reported on 07/18/2018) 30 tablet 1  . Prenatal Vit-Fe Fumarate-FA (PRENATAL MULTIVITAMIN) TABS tablet Take 1 tablet by mouth daily.      No current facility-administered medications on file prior to visit.     No Known Allergies  Social History:  reports that she has never smoked. She has never used smokeless tobacco. She reports that she does not drink alcohol or use drugs.  Family History  Problem Relation Age of Onset  . Heart attack Maternal Grandfather   . Heart disease Maternal Grandfather   . Heart disease Paternal Grandfather   . Stroke Paternal Grandfather     The following portions of the patient's history were reviewed and updated as appropriate: allergies, current  medications, past family history, past medical history, past social history, past surgical history and problem list.  Review of Systems Pertinent items noted in HPI and remainder of comprehensive ROS otherwise negative.   Objective:  BP 111/74   Pulse 94   Wt 117 lb 12.8 oz (53.4 kg)   BMI 22.26 kg/m  CONSTITUTIONAL: Well-developed, well-nourished female in no acute distress.  HENT:  Normocephalic, atraumatic, External right and left ear normal. Oropharynx is clear and moist EYES: Conjunctivae  and EOM are normal. Pupils are equal, round, and reactive to light. No scleral icterus.  NECK: Normal range of motion, supple, no masses.  Normal thyroid.  SKIN: Skin is warm and dry. No rash noted. Not diaphoretic. No erythema. No pallor. MUSCULOSKELETAL: Normal range of motion. No tenderness.  No cyanosis, clubbing, or edema.  NEUROLOGIC: Alert and oriented to person, place, and time. Normal reflexes, muscle tone coordination. No cranial nerve deficit noted. PSYCHIATRIC: Normal mood and affect. Normal behavior. Normal judgment and thought content. CARDIOVASCULAR: Normal heart rate noted, regular rhythm RESPIRATORY: Clear to auscultation bilaterally. Effort and breath sounds normal, no problems with respiration noted. ABDOMEN: Soft, no distention noted.  No tenderness, rebound or guarding. Incision well healed, negligible palpable scar tissue  Assessment and Plan:  1. Nausea and vomiting with poor nutrition --Emphasized with patient that I have low suspicion of c/s incision as source of problem 4 months postpartum --Discussed with patient that poor nutrition, infrequent vomiting may be the root cause of her abdominal pain --Reviewed recommendations for nutrition and hydration with leafy green vegetables and water throughout the day --Patient has not taken medication for abdominal pain. Attempt management with OTC Ibuprofen and Tylenol. --Consider heating pad, aerobic exercise and strength training, gentle stretching before and after physical activity --Patient to initiate food diary and track occurrence of abdominal pain, medications taken if any --Referral to nutritionist  Follow-up: PRN with PCP  Routine preventative health maintenance measures emphasized. Please refer to After Visit Summary for other counseling recommendations.   Total visit time 15 minutes. Greater than 50% of visit spent in counseling and coordination or care  Clayton Bibles, [facility] for  [employer], [facility]

## 2018-09-19 ENCOUNTER — Ambulatory Visit: Payer: BLUE CROSS/BLUE SHIELD

## 2018-10-31 ENCOUNTER — Ambulatory Visit: Payer: Self-pay | Admitting: Dietician

## 2019-02-19 NOTE — Progress Notes (Signed)
Last pap 07/2018-normal

## 2019-02-20 ENCOUNTER — Other Ambulatory Visit: Payer: Self-pay | Admitting: *Deleted

## 2019-02-20 ENCOUNTER — Encounter: Payer: Self-pay | Admitting: Family Medicine

## 2019-02-20 ENCOUNTER — Other Ambulatory Visit: Payer: Self-pay

## 2019-02-20 ENCOUNTER — Ambulatory Visit (INDEPENDENT_AMBULATORY_CARE_PROVIDER_SITE_OTHER): Payer: BC Managed Care – PPO | Admitting: Family Medicine

## 2019-02-20 ENCOUNTER — Other Ambulatory Visit: Payer: Self-pay | Admitting: Family Medicine

## 2019-02-20 VITALS — BP 106/69 | HR 85 | Wt 119.0 lb

## 2019-02-20 DIAGNOSIS — O34219 Maternal care for unspecified type scar from previous cesarean delivery: Secondary | ICD-10-CM | POA: Diagnosis not present

## 2019-02-20 DIAGNOSIS — Z3A10 10 weeks gestation of pregnancy: Secondary | ICD-10-CM | POA: Diagnosis not present

## 2019-02-20 DIAGNOSIS — Z113 Encounter for screening for infections with a predominantly sexual mode of transmission: Secondary | ICD-10-CM | POA: Diagnosis not present

## 2019-02-20 DIAGNOSIS — Z3491 Encounter for supervision of normal pregnancy, unspecified, first trimester: Secondary | ICD-10-CM | POA: Insufficient documentation

## 2019-02-20 MED ORDER — BLOOD PRESSURE CUFF MISC: 1.0000 | 0 refills | Status: DC

## 2019-02-20 NOTE — Progress Notes (Signed)
DATING AND VIABILITY SONOGRAM   Adrinne Sze is a 23 y.o. year old G2P1001 with LMP Patient's last menstrual period was 12/08/2018 (exact date). which would correlate to  [redacted]w[redacted]d weeks gestation.  She has regular menstrual cycles.   She is here today for a confirmatory initial sonogram.    GESTATION: SINGLETON yes      FETAL ACTIVITY:          Heart rate     168          The fetus is active.   GESTATIONAL AGE AND  BIOMETRICS:  Gestational criteria: Estimated Date of Delivery: 09/14/19 by LMP now at [redacted]w[redacted]d  Previous Scans:0  GESTATIONAL SAC            mm        weeks  CROWN RUMP LENGTH           3.70 mm        10.4 weeks                                                   AVERAGE EGA(BY THIS SCAN): 10.4 weeks  WORKING EDD( LMP ):  09/14/2019     TECHNICIAN COMMENTS:  Patient informed that the ultrasound is considered a limited obstetric ultrasound and is not intended to be a complete ultrasound exam. Patient also informed that the ultrasound is not being completed with the intent of assessing for fetal or placental anomalies or any pelvic abnormalities. Explained that the purpose of today's ultrasound is to assess for fetal heart rate. Patient acknowledges the purpose of the exam and the limitations of the study.     A copy of this report including all images has been saved and backed up to a second source for retrieval if needed. All measures and details of the anatomical scan, placentation, fluid volume and pelvic anatomy are contained in that report.  Crosby Oyster 02/20/2019 10:58 AM

## 2019-02-20 NOTE — Patient Instructions (Signed)

## 2019-02-20 NOTE — Progress Notes (Signed)
Subjective:   Alicia Rich is a 23 y.o. G2P1001 at [redacted]w[redacted]d by LMP, early ultrasound being seen today for her first obstetrical visit.  Her obstetrical history is significant for previous C-section, short interval between pregnancy. Patient does intend to breast feed. Pregnancy history fully reviewed.  Patient reports nausea and vomiting.  HISTORY: OB History  Gravida Para Term Preterm AB Living  2 1 1  0 0 1  SAB TAB Ectopic Multiple Live Births  0 0 0 0 1    # Outcome Date GA Lbr Len/2nd Weight Sex Delivery Anes PTL Lv  2 Current           1 Term 04/14/18 [redacted]w[redacted]d  6 lb 1 oz (2.75 kg) F CS-LTranv  N LIV     Complications: Breech presentation, antepartum   Last pap smear was  07/18/2018 and was normal Past Medical History:  Diagnosis Date  . Medical history non-contributory    Past Surgical History:  Procedure Laterality Date  . CESAREAN SECTION N/A 04/14/2018   Procedure: CESAREAN SECTION;  Surgeon: Woodroe Mode, MD;  Location: [place];  Service: Obstetrics;  Laterality: N/A;   Family History  Problem Relation Age of Onset  . Heart attack Maternal Grandfather   . Heart disease Maternal Grandfather   . Heart disease Paternal Grandfather   . Stroke Paternal Grandfather    Social History   Tobacco Use  . Smoking status: Never Smoker  . Smokeless tobacco: Never Used  Substance Use Topics  . Alcohol use: No  . Drug use: No   No Known Allergies Current Outpatient Medications on File Prior to Visit  Medication Sig Dispense Refill  . Prenatal Vit-Fe Fumarate-FA (PRENATAL MULTIVITAMIN) TABS tablet Take 1 tablet by mouth daily.      No current facility-administered medications on file prior to visit.      Exam   Vitals:   02/20/19 1038  BP: 106/69  Pulse: 85  Weight: 119 lb (54 kg)   Fetal Heart Rate (bpm): 168  System: General: well-developed, well-nourished female in no acute distress   Skin: normal coloration and turgor, no rashes   Neurologic: oriented, normal, negative, normal mood   Extremities: normal strength, tone, and muscle mass, ROM of all joints is normal   HEENT PERRLA, extraocular movement intact and sclera clear, anicteric   Mouth/Teeth mucous membranes moist, pharynx normal without lesions and dental hygiene good   Neck supple and no masses   Cardiovascular: regular rate and rhythm   Respiratory:  no respiratory distress, normal breath sounds   Abdomen: soft, non-tender; bowel sounds normal; no masses,  no organomegaly    Limited OB U/S reveals SIUP cw/ dates with + flicker Assessment:   Pregnancy: G2P1001 Patient Active Problem List   Diagnosis Date Noted  . Encounter for supervision of low-risk pregnancy in first trimester 02/20/2019  . Status post repeat low transverse cesarean section 04/14/2018     Plan:  1. Encounter for supervision of low-risk pregnancy in first trimester New OB labs - Obstetric Panel, Including HIV - Genetic Screening - Culture, OB Urine - Babyscripts Schedule Optimization - GC/Chlamydia probe amp ([place])not at [facility] - Enroll Patient in Babyscripts - [place] MFM OB COMP + 14 WK; Future  2. Previous C-section For breech Discussed TOLAC  Initial labs drawn. Continue prenatal vitamins. Genetic Screening discussed, NIPS: ordered. Ultrasound discussed; fetal anatomic survey: ordered. Problem list reviewed and updated. The nature of [place]  with multiple MDs and other Advanced Practice Providers was explained to patient; also emphasized that residents, students are part of our team. Routine obstetric precautions reviewed. Return in 4 weeks (on 03/20/2019).

## 2019-02-21 LAB — OBSTETRIC PANEL, INCLUDING HIV
Antibody Screen: NEGATIVE
Basophils Absolute: 0 10*3/uL (ref 0.0–0.2)
Basos: 0 %
EOS (ABSOLUTE): 0.1 10*3/uL (ref 0.0–0.4)
Eos: 1 %
HIV Screen 4th Generation wRfx: NONREACTIVE
Hematocrit: 38.7 % (ref 34.0–46.6)
Hemoglobin: 12.9 g/dL (ref 11.1–15.9)
Hepatitis B Surface Ag: NEGATIVE
Immature Grans (Abs): 0 10*3/uL (ref 0.0–0.1)
Immature Granulocytes: 0 %
Lymphocytes Absolute: 1.7 10*3/uL (ref 0.7–3.1)
Lymphs: 22 %
MCH: 29.3 pg (ref 26.6–33.0)
MCHC: 33.3 g/dL (ref 31.5–35.7)
MCV: 88 fL (ref 79–97)
Monocytes Absolute: 0.6 10*3/uL (ref 0.1–0.9)
Monocytes: 8 %
Neutrophils Absolute: 5.5 10*3/uL (ref 1.4–7.0)
Neutrophils: 69 %
Platelets: 249 10*3/uL (ref 150–450)
RBC: 4.4 x10E6/uL (ref 3.77–5.28)
RDW: 11.9 % (ref 11.7–15.4)
RPR Ser Ql: NONREACTIVE
Rh Factor: POSITIVE
Rubella Antibodies, IGG: 1.77 index (ref >0.99)
WBC: 8 10*3/uL (ref 3.4–10.8)

## 2019-02-21 LAB — GC/CHLAMYDIA PROBE AMP (~~LOC~~) NOT AT ARMC
Chlamydia: NEGATIVE
Neisseria Gonorrhea: NEGATIVE

## 2019-02-23 LAB — URINE CULTURE, OB REFLEX

## 2019-02-23 LAB — CULTURE, OB URINE

## 2019-02-25 ENCOUNTER — Other Ambulatory Visit: Payer: Self-pay | Admitting: Family Medicine

## 2019-02-25 DIAGNOSIS — O99891 Other specified diseases and conditions complicating pregnancy: Secondary | ICD-10-CM

## 2019-02-25 DIAGNOSIS — R8271 Bacteriuria: Secondary | ICD-10-CM | POA: Insufficient documentation

## 2019-02-25 MED ORDER — AMPICILLIN 500 MG PO CAPS: 500.0000 mg | ORAL_CAPSULE | Freq: Four times a day (QID) | ORAL | 0 refills | Status: DC

## 2019-02-26 ENCOUNTER — Other Ambulatory Visit (HOSPITAL_COMMUNITY): Payer: Self-pay | Admitting: Obstetrics and Gynecology

## 2019-02-27 ENCOUNTER — Telehealth: Payer: Self-pay | Admitting: Radiology

## 2019-02-27 NOTE — Telephone Encounter (Signed)
Called patient to give panorama results, no answer, no voicemail

## 2019-02-27 NOTE — Telephone Encounter (Signed)
error 

## 2019-02-28 ENCOUNTER — Encounter: Payer: Self-pay | Admitting: Radiology

## 2019-03-20 ENCOUNTER — Other Ambulatory Visit: Payer: Self-pay

## 2019-03-20 ENCOUNTER — Encounter: Payer: Self-pay | Admitting: Family Medicine

## 2019-03-20 ENCOUNTER — Ambulatory Visit (INDEPENDENT_AMBULATORY_CARE_PROVIDER_SITE_OTHER): Payer: BC Managed Care – PPO | Admitting: Family Medicine

## 2019-03-20 VITALS — BP 133/83 | HR 98 | Wt 116.0 lb

## 2019-03-20 DIAGNOSIS — O34219 Maternal care for unspecified type scar from previous cesarean delivery: Secondary | ICD-10-CM

## 2019-03-20 DIAGNOSIS — R8271 Bacteriuria: Secondary | ICD-10-CM

## 2019-03-20 DIAGNOSIS — O99891 Other specified diseases and conditions complicating pregnancy: Secondary | ICD-10-CM

## 2019-03-20 DIAGNOSIS — O9989 Other specified diseases and conditions complicating pregnancy, childbirth and the puerperium: Secondary | ICD-10-CM

## 2019-03-20 DIAGNOSIS — Z3482 Encounter for supervision of other normal pregnancy, second trimester: Secondary | ICD-10-CM

## 2019-03-20 DIAGNOSIS — Z3A14 14 weeks gestation of pregnancy: Secondary | ICD-10-CM

## 2019-03-20 MED ORDER — AMPICILLIN 500 MG PO CAPS: 500.0000 mg | ORAL_CAPSULE | Freq: Four times a day (QID) | ORAL | 0 refills | Status: DC

## 2019-03-20 NOTE — Progress Notes (Signed)
   PRENATAL VISIT NOTE  Subjective:  Alicia Rich is a 23 y.o. G2P1001 at [redacted]w[redacted]d being seen today for ongoing prenatal care.  She is currently monitored for the following issues for this low-risk pregnancy and has Previous cesarean section complicating pregnancy, antepartum condition or complication; Encounter for supervision of low-risk pregnancy in first trimester; and Asymptomatic bacteriuria during pregnancy on their problem list.  Patient reports heartburn.  Contractions: Not present. Vag. Bleeding: None.  Movement: Absent. Denies leaking of fluid.   The following portions of the patient's history were reviewed and updated as appropriate: allergies, current medications, past family history, past medical history, past social history, past surgical history and problem list.   Objective:   Vitals:   03/20/19 1041  BP: 133/83  Pulse: 98  Weight: 116 lb (52.6 kg)    Fetal Status: Fetal Heart Rate (bpm): 158 Fundal Height: 14 cm Movement: Absent     General:  Alert, oriented and cooperative. Patient is in no acute distress.  Skin: Skin is warm and dry. No rash noted.   Cardiovascular: Normal heart rate noted  Respiratory: Normal respiratory effort, no problems with respiration noted  Abdomen: Soft, gravid, appropriate for gestational age.  Pain/Pressure: Absent     Pelvic: Cervical exam deferred        Extremities: Normal range of motion.  Edema: None  Mental Status: Normal mood and affect. Normal behavior. Normal judgment and thought content.   Assessment and Plan:  Pregnancy: G2P1001 at [redacted]w[redacted]d 1. Encounter for supervision of other normal pregnancy in second trimester Up todate  2. Previous cesarean section complicating pregnancy, antepartum condition or complication Reviewed TOLAC, desires. Consent at 28wk  3. Asymptomatic bacteriuria during pregnancy Reviewed Ucx results, has not taken medication Will resend rx TOC in 3 wks - ampicillin (PRINCIPEN) 500 MG capsule; Take 1  capsule (500 mg total) by mouth 4 (four) times daily.  Dispense: 28 capsule; Refill: 0  Preterm labor symptoms and general obstetric precautions including but not limited to vaginal bleeding, contractions, leaking of fluid and fetal movement were reviewed in detail with the patient. Please refer to After Visit Summary for other counseling recommendations.   Return in about 4 weeks (around 04/17/2019) for Routine prenatal care, Telehealth/Virtual health OB Visit.  Future Appointments  Date Time Provider [place]  04/10/2019 10:30 AM [facility] NURSE [facility]  04/17/2019 10:30 AM Caren Macadam, MD [facility]  04/24/2019 10:15 AM [facility] [place] 4 [facility]    Caren Macadam, MD

## 2019-04-10 ENCOUNTER — Other Ambulatory Visit: Payer: Self-pay

## 2019-04-10 ENCOUNTER — Other Ambulatory Visit (INDEPENDENT_AMBULATORY_CARE_PROVIDER_SITE_OTHER): Payer: BC Managed Care – PPO | Admitting: *Deleted

## 2019-04-10 DIAGNOSIS — Z23 Encounter for immunization: Secondary | ICD-10-CM | POA: Diagnosis not present

## 2019-04-10 DIAGNOSIS — Z3491 Encounter for supervision of normal pregnancy, unspecified, first trimester: Secondary | ICD-10-CM

## 2019-04-10 NOTE — Progress Notes (Signed)
Pt here for TOC for urine culture and request a Flu vaccine today

## 2019-04-12 LAB — URINE CULTURE, OB REFLEX

## 2019-04-12 LAB — CULTURE, OB URINE

## 2019-04-17 ENCOUNTER — Encounter: Payer: Self-pay | Admitting: *Deleted

## 2019-04-17 ENCOUNTER — Telehealth: Payer: BC Managed Care – PPO | Admitting: Family Medicine

## 2019-04-22 ENCOUNTER — Encounter (HOSPITAL_COMMUNITY): Payer: Self-pay

## 2019-04-22 ENCOUNTER — Inpatient Hospital Stay (HOSPITAL_COMMUNITY)
Admission: AD | Admit: 2019-04-22 | Discharge: 2019-04-22 | Disposition: A | Discharge status: Home / Self Care | Payer: BC Managed Care – PPO | Attending: Obstetrics and Gynecology | Admitting: Obstetrics and Gynecology

## 2019-04-22 ENCOUNTER — Other Ambulatory Visit: Payer: Self-pay

## 2019-04-22 DIAGNOSIS — N949 Unspecified condition associated with female genital organs and menstrual cycle: Secondary | ICD-10-CM

## 2019-04-22 DIAGNOSIS — R102 Pelvic and perineal pain: Secondary | ICD-10-CM | POA: Diagnosis not present

## 2019-04-22 DIAGNOSIS — R1031 Right lower quadrant pain: Secondary | ICD-10-CM | POA: Diagnosis not present

## 2019-04-22 DIAGNOSIS — O34219 Maternal care for unspecified type scar from previous cesarean delivery: Secondary | ICD-10-CM | POA: Diagnosis not present

## 2019-04-22 DIAGNOSIS — O26892 Other specified pregnancy related conditions, second trimester: Secondary | ICD-10-CM | POA: Diagnosis not present

## 2019-04-22 DIAGNOSIS — Z3A19 19 weeks gestation of pregnancy: Secondary | ICD-10-CM | POA: Insufficient documentation

## 2019-04-22 LAB — URINALYSIS, ROUTINE W REFLEX MICROSCOPIC
Bilirubin Urine: NEGATIVE
Glucose, UA: NEGATIVE mg/dL
Hgb urine dipstick: NEGATIVE
Ketones, ur: NEGATIVE mg/dL
Leukocytes,Ua: NEGATIVE
Nitrite: NEGATIVE
Protein, ur: NEGATIVE mg/dL
Specific Gravity, Urine: 1.02 (ref 1.005–1.030)
pH: 6 (ref 5.0–8.0)

## 2019-04-22 NOTE — Discharge Instructions (Signed)

## 2019-04-22 NOTE — MAU Provider Note (Signed)
Patient Alicia Rich is a 23 y.o. G2P1001 At [redacted]w[redacted]d here with complaints of abdominal pain that she rates a 10/10 when she has it strong, and then a 6/10 when its milder. She denies VB, LOF, ,pain with urination, vaginal discharge, or other ob-gyn complaints. She works at [employer]; she walks a lot at work. Today she felt "sharp" pains that made her have to sit down at work; she came in to get checked out.   She was involved in setting up for her daughters party over the weekend and setting up; also chasing her 6 year old daughter.  Tries to rest in between but acknowledges that it is hard with her job and small child.  History     CSN: [id number]  Arrival date and time: 04/22/19 2202   First Provider Initiated Contact with Patient 04/22/19 [phone]      Chief Complaint  Patient presents with  . Abdominal Pain   Abdominal Pain This is a new problem. The current episode started in the past 7 days. The onset quality is sudden. The problem occurs intermittently. The problem has been gradually improving. The pain is located in the suprapubic region and LLQ. The pain is at a severity of 10/10. The quality of the pain is cramping and sharp. The abdominal pain radiates to the suprapubic region and RLQ. Pertinent negatives include no constipation, diarrhea, dysuria, fever, nausea or vomiting. Associated symptoms comments: Denies constipation; has not had BM since Friday (three days ago). . The pain is aggravated by movement. The pain is relieved by being still. She has tried nothing (tried resting over the weekend.  ) for the symptoms.    OB History    Gravida  2   Para  1   Term  1   Preterm  0   AB  0   Living  1     SAB  0   TAB  0   Ectopic  0   Multiple  0   Live Births  1           Past Medical History:  Diagnosis Date  . Medical history non-contributory     Past Surgical History:  Procedure Laterality Date  . CESAREAN SECTION N/A 04/14/2018   Procedure: CESAREAN  SECTION;  Surgeon: Woodroe Mode, MD;  Location: [facility];  Service: Obstetrics;  Laterality: N/A;  . NO PAST SURGERIES      Family History  Problem Relation Age of Onset  . Heart attack Maternal Grandfather   . Heart disease Maternal Grandfather   . Heart disease Paternal Grandfather   . Stroke Paternal Grandfather     Social History   Tobacco Use  . Smoking status: Never Smoker  . Smokeless tobacco: Never Used  Substance Use Topics  . Alcohol use: No  . Drug use: No    Allergies: No Known Allergies  Medications Prior to Admission  Medication Sig Dispense Refill Last Dose  . ampicillin (PRINCIPEN) 500 MG capsule Take 1 capsule (500 mg total) by mouth 4 (four) times daily. 28 capsule 0 Past Month at Unknown time  . Prenatal Vit-Fe Fumarate-FA (PRENATAL MULTIVITAMIN) TABS tablet Take 1 tablet by mouth daily.    04/21/2019 at Unknown time  . Blood Pressure Monitoring (BLOOD PRESSURE CUFF) MISC 1 Device by Does not apply route once a week. To be monitored weekly from home (Patient not taking: Reported on 03/20/2019) 1 each 0     Review of Systems  Constitutional: Negative  for fever.  HENT: Negative.   Respiratory: Negative.   Gastrointestinal: Positive for abdominal pain. Negative for constipation, diarrhea, nausea and vomiting.  Genitourinary: Negative for dysuria, vaginal bleeding, vaginal discharge and vaginal pain.  Neurological: Negative.    Physical Exam   Blood pressure (!) 103/59, pulse 87, resp. rate 18, height 5' (1.524 m), weight 54.4 kg, last menstrual period 12/08/2018, SpO2 100 %, unknown if currently breastfeeding.  Physical Exam  Constitutional: She is oriented to person, place, and time. She appears well-developed.  HENT:  Head: Normocephalic.  Eyes: Pupils are equal, round, and reactive to light.  Neck: Normal range of motion.  Respiratory: Effort normal.  GI: Soft.  Genitourinary:    Vagina normal.     Genitourinary Comments: NEFG; cervix  is long, closed, posterior.    Musculoskeletal: Normal range of motion.  Neurological: She is alert and oriented to person, place, and time.  Skin: Skin is warm and dry.  Psychiatric: She has a normal mood and affect.    MAU Course  Procedures  MDM -FHR by Doppler 160 -Cervix is closed -No tenderness on palpation, no concern for surgical abdomen. -UA is clear -declined GC CT and wet prep tests.   Assessment and Plan   1. Round ligament pain   2. Previous cesarean section complicating pregnancy, antepartum condition or complication    2. Patient stable for discharge; recommend pregnancy support belt, prenatal yoga.   3. Educated patient on reasons to return to MAU, difference in contractions versus RLP.   4. Try to avoid constipation, as this may aggravate physiological aches and pains or pregnancy.   Charlesetta Garibaldi Tahara Ruffini 04/22/2019, 9:13 AM

## 2019-04-22 NOTE — MAU Note (Signed)
Pt has been having sharp lower abdominal pain which radiates to her back since Saturday night. Symptoms are worse when she walks or moves to stand. Had slight nausea this morning. Denies LOF or bleeding. Heat helps.

## 2019-04-24 ENCOUNTER — Telehealth (INDEPENDENT_AMBULATORY_CARE_PROVIDER_SITE_OTHER): Payer: BC Managed Care – PPO | Admitting: Advanced Practice Midwife

## 2019-04-24 ENCOUNTER — Other Ambulatory Visit (HOSPITAL_COMMUNITY): Payer: BC Managed Care – PPO

## 2019-04-24 ENCOUNTER — Other Ambulatory Visit: Payer: Self-pay

## 2019-04-24 DIAGNOSIS — Z3482 Encounter for supervision of other normal pregnancy, second trimester: Secondary | ICD-10-CM

## 2019-04-24 DIAGNOSIS — Z3A19 19 weeks gestation of pregnancy: Secondary | ICD-10-CM

## 2019-04-24 DIAGNOSIS — O34219 Maternal care for unspecified type scar from previous cesarean delivery: Secondary | ICD-10-CM

## 2019-04-24 NOTE — Patient Instructions (Signed)

## 2019-04-24 NOTE — Progress Notes (Signed)
Patient reports not being able to check her blood pressure at this time. Will reach out to [place]

## 2019-04-24 NOTE — Progress Notes (Signed)
   TELEHEALTH OBSTETRICS PRENATAL VIRTUAL VIDEO VISIT ENCOUNTER NOTE  Provider location: [facility] for [place] at [facility]   I connected with Alicia Rich on 04/24/19 at  2:30 PM EDT by MyChart Video Encounter at home and verified that I am speaking with the correct person using two identifiers.   I discussed the limitations, risks, security and privacy concerns of performing an evaluation and management service virtually and the availability of in person appointments. I also discussed with the patient that there may be a patient responsible charge related to this service. The patient expressed understanding and agreed to proceed.  Subjective:  Alicia Rich is a 23 y.o. G2P1001 at [redacted]w[redacted]d being seen today for ongoing prenatal care.  She is currently monitored for the following issues for this low-risk pregnancy and has Previous cesarean section complicating pregnancy, antepartum condition or complication; Encounter for supervision of low-risk pregnancy in first trimester; and Asymptomatic bacteriuria during pregnancy on their problem list.  Patient reports no complaints.   .  .   . Denies any leaking of fluid.   The following portions of the patient's history were reviewed and updated as appropriate: allergies, current medications, past family history, past medical history, past social history, past surgical history and problem list.   Objective:  There were no vitals filed for this visit.  Fetal Status:           General:  Alert, oriented and cooperative. Patient is in no acute distress.  Respiratory: Normal respiratory effort, no problems with respiration noted  Mental Status: Normal mood and affect. Normal behavior. Normal judgment and thought content.  Rest of physical exam deferred due to type of encounter  Imaging: No results found.  Assessment and Plan:  Pregnancy: G2P1001 at [redacted]w[redacted]d 1. Encounter for supervision of other normal pregnancy in second trimester -  Continue routine care  - Rescheduled 19 week scan - Discussed interval virtual appt at 24 weeks  2. Previous cesarean section complicating pregnancy, antepartum condition or complication - Desires TOLAC  Preterm labor symptoms and general obstetric precautions including but not limited to vaginal bleeding, contractions, leaking of fluid and fetal movement were reviewed in detail with the patient. I discussed the assessment and treatment plan with the patient. The patient was provided an opportunity to ask questions and all were answered. The patient agreed with the plan and demonstrated an understanding of the instructions. The patient was advised to call back or seek an in-person office evaluation/go to MAU at [facility] for any urgent or concerning symptoms. Please refer to After Visit Summary for other counseling recommendations.   I provided 5 minutes of face-to-face time during this encounter.  Return in about 4 weeks (around 05/22/2019) for Virtual appt.  Future Appointments  Date Time Provider [place]  05/10/2019  8:30 AM [facility] [place], [place] for [employer], [place]

## 2019-05-10 ENCOUNTER — Ambulatory Visit (HOSPITAL_COMMUNITY)
Admission: RE | Admit: 2019-05-10 | Discharge: 2019-05-10 | Disposition: A | Discharge status: Home / Self Care | Payer: BC Managed Care – PPO | Source: Ambulatory Visit | Attending: Obstetrics and Gynecology | Admitting: Obstetrics and Gynecology

## 2019-05-10 ENCOUNTER — Other Ambulatory Visit: Payer: Self-pay

## 2019-05-10 DIAGNOSIS — Z363 Encounter for antenatal screening for malformations: Secondary | ICD-10-CM

## 2019-05-10 DIAGNOSIS — O359XX0 Maternal care for (suspected) fetal abnormality and damage, unspecified, not applicable or unspecified: Secondary | ICD-10-CM | POA: Diagnosis not present

## 2019-05-10 DIAGNOSIS — Z3491 Encounter for supervision of normal pregnancy, unspecified, first trimester: Secondary | ICD-10-CM

## 2019-05-10 DIAGNOSIS — Z3A21 21 weeks gestation of pregnancy: Secondary | ICD-10-CM | POA: Diagnosis not present

## 2019-05-22 ENCOUNTER — Other Ambulatory Visit: Payer: Self-pay

## 2019-05-22 ENCOUNTER — Telehealth (INDEPENDENT_AMBULATORY_CARE_PROVIDER_SITE_OTHER): Payer: BC Managed Care – PPO | Admitting: Advanced Practice Midwife

## 2019-05-22 DIAGNOSIS — Z3A23 23 weeks gestation of pregnancy: Secondary | ICD-10-CM

## 2019-05-22 DIAGNOSIS — O34219 Maternal care for unspecified type scar from previous cesarean delivery: Secondary | ICD-10-CM

## 2019-05-22 NOTE — Progress Notes (Signed)
TELEHEALTH OBSTETRICS PRENATAL VIRTUAL VIDEO VISIT ENCOUNTER NOTE  [facility]   I connected with Alicia GrandJustine Abad on 05/22/19 at  2:15 PM EST by MyChart Video Encounter at home and verified that I am speaking with the correct person using two identifiers.   I discussed the limitations, risks, security and privacy concerns of performing an evaluation and management service virtually and the availability of in person appointments. I also discussed with the patient that there may be a patient responsible charge related to this service. The patient expressed understanding and agreed to proceed. Subjective:  Alicia Rich is a 23 y.o. G2P1001 at 6536w4d being seen today for ongoing prenatal care.  She is currently monitored for the following issues for this low-risk pregnancy and has Previous cesarean section complicating pregnancy, antepartum condition or complication; Encounter for supervision of low-risk pregnancy in first trimester; and Asymptomatic bacteriuria during pregnancy on their problem list.  Patient reports nausea related to eating large meals and "certain foods".  Contractions: Not present.  .  Movement: Present. Denies any leaking of fluid.   The following portions of the patient's history were reviewed and updated as appropriate: allergies, current medications, past family history, past medical history, past social history, past surgical history and problem list.   Objective:  There were no vitals filed for this visit.  Fetal Status:     Movement: Present     General:  Alert, oriented and cooperative. Patient is in no acute distress.  Respiratory: Normal respiratory effort, no problems with respiration noted  Mental Status: Normal mood and affect. Normal behavior. Normal judgment and thought content.  Rest of physical exam deferred due to type of encounter  Imaging: [place] Mfm Ob Comp + 14 Wk  Result Date: 05/10/2019  ----------------------------------------------------------------------  OBSTETRICS REPORT                       (Signed Final 05/10/2019 10:17 am) ---------------------------------------------------------------------- Patient Info  ID #:       [id number]                          D.O.B.:  05/19/1996 (23 yrs)  Name:       Alicia Rich                Visit Date: 05/10/2019 09:28 am ---------------------------------------------------------------------- Performed By  Performed By:     Eden Lathearrie Stalter BS      Ref. Address:     [address] [place]                    [address]                                                             [address]  Attending:        Ma RingsVictor Fang MD         [facility]  Referred By:      [facility] Mila Merry ---------------------------------------------------------------------- Orders   #  Description                          Code         Ordered By   1  [place] MFM OB COMP + 14 WK               [id number]     Tinnie Gens  ----------------------------------------------------------------------   #  Order #                    Accession #                 Episode #   1  [id number]                  [id number]                  [id number]  ---------------------------------------------------------------------- Indications   Fetal abnormality - other known or suspected   O35.9XX0   [redacted] weeks gestation of pregnancy                Z3A.21   Antenatal screening for malformations          Z36.3  ---------------------------------------------------------------------- Fetal Evaluation  Num Of Fetuses:         1  Fetal Heart Rate(bpm):  159  Cardiac Activity:       Observed  Presentation:           Cephalic  Placenta:               Posterior  P. Cord Insertion:      Visualized  Amniotic Fluid  AFI FV:      Within normal limits                              Largest Pocket(cm)                              7.42  ---------------------------------------------------------------------- Biometry  BPD:      57.4  mm     G. Age:  23w 4d         95  %    CI:         77.3   %    70 - 86                                                          FL/HC:      17.2   %    18.4 - 20.2  HC:      206.7  mm     G. Age:  22w 5d         76  %    HC/AC:      1.19        1.06 - 1.25  AC:      173.7  mm     G. Age:  22w 2d         57  %    FL/BPD:     62.0   %  71 - 87  FL:       35.6  mm     G. Age:  21w 2d         21  %    FL/AC:      20.5   %    20 - 24  HUM:        35  mm     G. Age:  22w 0d         26  %  CER:      23.6  mm     G. Age:  21w 6d         48  %  NFT:         4  mm  CM:        4.4  mm  Est. FW:     468  gm      1 lb 1 oz     52  % ---------------------------------------------------------------------- OB History  Gravidity:    2         Term:   1        Prem:   0        SAB:   0  TOP:          0       Ectopic:  0        Living: 1 ---------------------------------------------------------------------- Gestational Age  LMP:           21w 6d        Date:  12/08/18                 EDD:   09/14/19  U/S Today:     22w 3d                                        EDD:   09/10/19  Best:          21w 6d     Det. By:  LMP  (12/08/18)          EDD:   09/14/19 ---------------------------------------------------------------------- Anatomy  Cranium:               Appears normal         LVOT:                   Appears normal  Cavum:                 Appears normal         Aortic Arch:            Appears normal  Ventricles:            Appears normal         Ductal Arch:            Appears normal  Choroid Plexus:        Appears normal         Diaphragm:              Appears normal  Cerebellum:            Appears normal         Stomach:                Appears normal, left  sided  Posterior Fossa:       Appears normal         Abdomen:                Appears normal  Nuchal Fold:            Not applicable (>20    Abdominal Wall:         Appears nml (cord                         wks GA)                                        insert, abd wall)  Face:                  Appears normal         Cord Vessels:           Appears normal (3                         (orbits and profile)                           vessel cord)  Lips:                  Appears normal         Kidneys:                Appear normal  Palate:                Appears normal         Bladder:                Appears normal  Thoracic:              Appears normal         Spine:                  Appears normal  Heart:                 Appears normal         Upper Extremities:      Appears normal                         (4CH, axis, and                         situs)  RVOT:                  Appears normal         Lower Extremities:      Appears normal  Other:  Heels and right 5th digit visualized. Nasal bone visualized. ---------------------------------------------------------------------- Cervix Uterus Adnexa  Cervix  Length:           3.43  cm.  Normal appearance by transabdominal scan.  Uterus  No abnormality visualized.  Left Ovary  Not visualized.  Right Ovary  Within normal limits.  Cul De Sac  No free fluid seen.  Adnexa  No abnormality visualized. ---------------------------------------------------------------------- Comments  This patient was seen for a detailed fetal anatomy scan. She  denies any significant past medical history and denies any  problems in her current pregnancy.  She had a cell free DNA test earlier in her pregnancy which  indicated a low risk for trisomy 46, 74, and 13.  The patient reports that she was notified by Avelina Laine that a  female fetus is predicted.  However during today's ultrasound,  a female fetus is noted.  We obtained a copy of the patient's  cell free DNA results from her chart which stated that a female  fetus is predicted.  The patient was advised to check the  email she received from [facility] to make sure that  she  received the correct report.  We verified that her name and  date of birth from the report which stated a female fetus is  predicted was correct.  She was informed that the fetal growth and amniotic fluid  level were appropriate for her gestational age.  There were no obvious fetal anomalies noted on today's  ultrasound exam.  The patient was informed that anomalies may be missed due  to technical limitations. If the fetus is in a suboptimal position  or maternal habitus is increased, visualization of the fetus in  the maternal uterus may be impaired.  The patient was reassured that the cell free DNA test report  she received via email was most likely not correct.  Due to  the discrepancy in the test results, she was offered and  declined an amniocentesis today for definitive diagnosis of  fetal aneuploidy and the gender.  She reports that she is  comfortable with what we showed her today.  The patient was  given a copy of the cell free DNA test results we printed from  her chart indicating that a female fetus is present.  Follow up as indicated. ----------------------------------------------------------------------                   Ma Rings, MD Electronically Signed Final Report   05/10/2019 10:17 am ----------------------------------------------------------------------   Assessment and Plan:  Pregnancy: G2P1001 at [redacted]w[redacted]d 1. Previous cesarean section complicating pregnancy, antepartum condition or complication Alexie's pregnancy is progressing appropriately; she denies any concerning complaints today. Patient reports nausea with eating large meals and particular foods. Patient counseled to stand when eating is she is experiencing fullness/early satiety with sitting and to eat several small meals during the day. Patient still does not have a blood pressure cuff. Patient says that she will think about whether she prefers to buy a blood pressure cuff out of pocket or present to clinic. Patient also shares  that she desires TOLAC.    Preterm labor symptoms and general obstetric precautions including but not limited to vaginal bleeding, contractions, leaking of fluid and fetal movement were reviewed in detail with the patient. I discussed the assessment and treatment plan with the patient. The patient was provided an opportunity to ask questions and all were answered. The patient agreed with the plan and demonstrated an understanding of the instructions. The patient was advised to call back or seek an in-person office evaluation/go to MAU at [facility] for any urgent or concerning symptoms. Please refer to After Visit Summary for other counseling recommendations.   I provided 10 minutes of face-to-face time during this encounter.  Return in about 5 weeks (around 06/26/2019) for in person 28 week labs, GTT.  Future Appointments  Date Time Provider Department Center  06/26/2019  9:45 AM Federico Flake, MD [facility]    [place], [facility] for [employer], [facility]

## 2019-05-22 NOTE — Progress Notes (Signed)
I connected with  Alicia Rich on 05/22/19 at  2:15 PM EST by telephone and verified that I am speaking with the correct person using two identifiers.   I discussed the limitations, risks, security and privacy concerns of performing an evaluation and management service by telephone and the availability of in person appointments. I also discussed with the patient that there may be a patient responsible charge related to this service. The patient expressed understanding and agreed to proceed.  Gabreille Dardis Jeanella Anton, CMA 05/22/2019  2:24 PM

## 2019-05-22 NOTE — Patient Instructions (Signed)
Preparing for Vaginal Birth After Cesarean Delivery Vaginal birth after cesarean delivery (VBAC) is giving birth vaginally after previously delivering a baby through a cesarean section (C-section). You and your health are provider will discuss your options and whether you may be a good candidate for VBAC. What are my options? After a cesarean delivery, your options for future deliveries may include:  Scheduled repeat cesarean delivery. This is done in a hospital with an operating room.  Trial of labor after cesarean (TOLAC). A successful TOLAC results in a vaginal delivery. If it is not successful, you will need to have a cesarean delivery. TOLAC should be attempted in facilities where an emergency cesarean delivery can be performed. It should not be done as a home birth. Talk with your health care provider about the risks and benefits of each option early in your pregnancy. The best option for you will depend on your preferences and your overall health as well as your baby's. What should I know about my past cesarean delivery? It is important to know what type of incision was made in your uterus in a past cesarean delivery. The type of incision can affect the success of your TOLAC. Types of incisions include:  Low transverse. This is a side-to-side cut low on your uterus. The scar on your skin looks like a horizontal line just above your pubic area. This type of cut is the most common and makes you a good candidate for TOLAC.  Low vertical. This is an up-and-down cut low on your uterus. The scar on your skin looks like a vertical line between your pubic area and belly button. This type of cut puts you at higher risk for problems during TOLAC.  High vertical or classical. This is an up-and-down cut high on your uterus. The scar on your skin looks like a vertical line that runs over the top of your belly button. This type of cut has the highest risk for problems and usually means that TOLAC is not an  option. When is VBAC not an option? As you progress through your pregnancy, circumstances may change and you may need to reconsider your options. Your situation may also change even as you begin TOLAC. Your health care provider may not want you to attempt a VBAC if you:  Need to have labor started (induced) because your cervix is not ready for labor.  Have never had a vaginal delivery.  Have had more than two cesarean deliveries.  Are overdue.  Are pregnant with a very large baby.  Have a condition that causes high blood pressure (preeclampsia). Questions to ask your health care provider  Am I a good candidate for TOLAC?  What are my chances of a successful vaginal delivery?  Is my preferred birth location equipped for a TOLAC?  What are my pain management options during a TOLAC? Where to find more information  American Congress of Obstetricians and Gynecologists: www.acog.org  American College of Nurse-Midwives: www.midwife.org Summary  Vaginal birth after cesarean delivery (VBAC) is giving birth vaginally after previously delivering a baby through a cesarean section (C-section).  VBAC may be a safe and appropriate option for you depending on your medical history and other risk factors. Talk with your health care provider about the options available to you, and the risks and benefits of each early in your pregnancy.  TOLAC should be attempted in facilities where emergency cesarean section procedures can be performed. This information is not intended to replace advice given to you by   your health care provider. Make sure you discuss any questions you have with your health care provider. Document Released: 03/15/2011 Document Revised: 10/23/2018 Document Reviewed: 10/06/2016 Elsevier Patient Education  2020 [employer]  

## 2019-06-26 ENCOUNTER — Ambulatory Visit (INDEPENDENT_AMBULATORY_CARE_PROVIDER_SITE_OTHER): Payer: BC Managed Care – PPO | Admitting: Family Medicine

## 2019-06-26 ENCOUNTER — Other Ambulatory Visit: Payer: Self-pay

## 2019-06-26 VITALS — BP 106/76 | HR 72 | Wt 129.0 lb

## 2019-06-26 DIAGNOSIS — Z3A28 28 weeks gestation of pregnancy: Secondary | ICD-10-CM

## 2019-06-26 DIAGNOSIS — Z3483 Encounter for supervision of other normal pregnancy, third trimester: Secondary | ICD-10-CM

## 2019-06-26 DIAGNOSIS — Z3491 Encounter for supervision of normal pregnancy, unspecified, first trimester: Secondary | ICD-10-CM

## 2019-06-26 DIAGNOSIS — Z3482 Encounter for supervision of other normal pregnancy, second trimester: Secondary | ICD-10-CM

## 2019-06-26 NOTE — Progress Notes (Signed)
   PRENATAL VISIT NOTE  Subjective:  Alicia Rich is a 23 y.o. G2P1001 at [redacted]w[redacted]d being seen today for ongoing prenatal care.  She is currently monitored for the following issues for this low-risk pregnancy and has Previous cesarean section complicating pregnancy, antepartum condition or complication; Encounter for supervision of low-risk pregnancy in first trimester; and Asymptomatic bacteriuria during pregnancy on their problem list.  Patient reports no complaints.  Contractions: Not present. Vag. Bleeding: None.  Movement: Present. Denies leaking of fluid.   The following portions of the patient's history were reviewed and updated as appropriate: allergies, current medications, past family history, past medical history, past social history, past surgical history and problem list.   Objective:   Vitals:   06/26/19 0939  BP: 106/76  Pulse: 72  Weight: 129 lb (58.5 kg)    Fetal Status: Fetal Heart Rate (bpm): 145 Fundal Height: 28 cm Movement: Present     General:  Alert, oriented and cooperative. Patient is in no acute distress.  Skin: Skin is warm and dry. No rash noted.   Cardiovascular: Normal heart rate noted  Respiratory: Normal respiratory effort, no problems with respiration noted  Abdomen: Soft, gravid, appropriate for gestational age.  Pain/Pressure: Absent     Pelvic: Cervical exam deferred        Extremities: Normal range of motion.  Edema: None  Mental Status: Normal mood and affect. Normal behavior. Normal judgment and thought content.   Assessment and Plan:  Pregnancy: G2P1001 at [redacted]w[redacted]d  1. Encounter for supervision of other normal pregnancy in second trimester Up dated box Needs to be offered Tdap again Briefly discussed TOLAC but needs thorough discussion - 2 Hour GTT - CBC - RPR - HIV antibody  Preterm labor symptoms and general obstetric precautions including but not limited to vaginal bleeding, contractions, leaking of fluid and fetal movement were  reviewed in detail with the patient. Please refer to After Visit Summary for other counseling recommendations.   Return in about 2 weeks (around 07/10/2019) for Routine prenatal care, Telehealth/Virtual health OB Visit.  Future Appointments  Date Time Provider [place]  07/10/2019 11:30 AM Caren Macadam, MD [facility]    Caren Macadam, MD

## 2019-06-27 LAB — CBC
Hematocrit: 34.5 % (ref 34.0–46.6)
Hemoglobin: 11.4 g/dL (ref 11.1–15.9)
MCH: 30.4 pg (ref 26.6–33.0)
MCHC: 33 g/dL (ref 31.5–35.7)
MCV: 92 fL (ref 79–97)
Platelets: 309 10*3/uL (ref 150–450)
RBC: 3.75 x10E6/uL — ABNORMAL LOW (ref 3.77–5.28)
RDW: 11.9 % (ref 11.7–15.4)
WBC: 8.1 10*3/uL (ref 3.4–10.8)

## 2019-06-27 LAB — GLUCOSE TOLERANCE, 2 HOURS W/ 1HR
Glucose, 1 hour: 75 mg/dL (ref 65–179)
Glucose, 2 hour: 95 mg/dL (ref 65–152)
Glucose, Fasting: 69 mg/dL (ref 65–91)

## 2019-06-27 LAB — HIV ANTIBODY (ROUTINE TESTING W REFLEX): HIV Screen 4th Generation wRfx: NONREACTIVE

## 2019-06-27 LAB — RPR: RPR Ser Ql: NONREACTIVE

## 2019-07-10 ENCOUNTER — Encounter: Payer: Self-pay | Admitting: Family Medicine

## 2019-07-10 ENCOUNTER — Other Ambulatory Visit: Payer: Self-pay

## 2019-07-10 ENCOUNTER — Ambulatory Visit (INDEPENDENT_AMBULATORY_CARE_PROVIDER_SITE_OTHER): Payer: BC Managed Care – PPO | Admitting: Family Medicine

## 2019-07-10 DIAGNOSIS — Z3491 Encounter for supervision of normal pregnancy, unspecified, first trimester: Secondary | ICD-10-CM

## 2019-07-10 DIAGNOSIS — O34219 Maternal care for unspecified type scar from previous cesarean delivery: Secondary | ICD-10-CM

## 2019-07-10 DIAGNOSIS — Z3A30 30 weeks gestation of pregnancy: Secondary | ICD-10-CM

## 2019-07-10 NOTE — Progress Notes (Signed)
   PRENATAL VISIT NOTE  Subjective:  Alicia Rich is a 23 y.o. G2P1001 at [redacted]w[redacted]d being seen today for ongoing prenatal care.  She is currently monitored for the following issues for this low-risk pregnancy and has Previous cesarean section complicating pregnancy, antepartum condition or complication; Encounter for supervision of low-risk pregnancy in first trimester; and Asymptomatic bacteriuria during pregnancy on their problem list.  Patient reports no complaints.  Contractions: Not present. Vag. Bleeding: None.  Movement: Present. Denies leaking of fluid.   The following portions of the patient's history were reviewed and updated as appropriate: allergies, current medications, past family history, past medical history, past social history, past surgical history and problem list.   Objective:   Vitals:   07/10/19 1139  BP: 110/75  Pulse: 98  Weight: 128 lb (58.1 kg)    Fetal Status: Fetal Heart Rate (bpm): 150 Fundal Height: 30 cm Movement: Present     General:  Alert, oriented and cooperative. Patient is in no acute distress.  Skin: Skin is warm and dry. No rash noted.   Cardiovascular: Normal heart rate noted  Respiratory: Normal respiratory effort, no problems with respiration noted  Abdomen: Soft, gravid, appropriate for gestational age.  Pain/Pressure: Absent     Pelvic: Cervical exam deferred        Extremities: Normal range of motion.  Edema: None  Mental Status: Normal mood and affect. Normal behavior. Normal judgment and thought content.   Assessment and Plan:  Pregnancy: G2P1001 at [redacted]w[redacted]d 1. Encounter for supervision of low-risk pregnancy in first trimester Up to date Discussed TOLAC Reviewed circ and contraception, box updated Did not discuss TDap- last was in 01/2018. Needs to be offered repeat Tdap at next visit- placed in pink sticky note.   2. Previous cesarean section complicating pregnancy, antepartum condition or complication Discussed TOLAC risk including  uterine rupture and risk of repeat CS. Reviewed assessing desired family size. Given TOLAC consent to review at home and will continue to discuss at next visit.    Preterm labor symptoms and general obstetric precautions including but not limited to vaginal bleeding, contractions, leaking of fluid and fetal movement were reviewed in detail with the patient. Please refer to After Visit Summary for other counseling recommendations.   Return in about 2 weeks (around 07/24/2019) for Routine prenatal care, Telehealth/Virtual health OB Visit.  Future Appointments  Date Time Provider Department Center  07/24/2019 11:30 AM Caren Macadam, MD [facility]    Caren Macadam, MD

## 2019-07-10 NOTE — Patient Instructions (Signed)
Places to have your son circumcised:                                                                      [facility]     [phone] while you are in hospital         [facility]              [phone]   $269 by 4 wks                      [facility]                     [phone]   $269 by 7 days [facility]                    [phone]   $269 by 4 wks [facility]             [phone]   $225 by 2 wks    These prices sometimes change but are roughly what you can expect to pay. Please call and confirm pricing.   Circumcision is considered an elective/non-medically necessary procedure. There are many reasons parents decide to have their sons circumsized. During the first year of life circumcised males have a reduced risk of urinary tract infections but after this year the rates between circumcised males and uncircumcised males are the same.  It is safe to have your son circumcised outside of the hospital and the places above perform them regularly.   Deciding about Circumcision in Baby Boys  (Up-to-date The Basics)  What is circumcision?   Circumcision is a surgery that removes the skin that covers the tip of the penis, called the "foreskin" Circumcision is usually done when a boy is between 1 and 9 days old. In the [place], circumcision is common. In some other countries, fewer boys are circumcised. Circumcision is a common tradition in some religions.  Should I have my baby boy circumcised?   There is no easy answer. Circumcision has some benefits. But it also has risks. After talking with your doctor, you will have to decide for yourself what is right for your family.  What are the benefits of circumcision?   Circumcised boys seem to have slightly lower rates of: ?Urinary tract infections ?Swelling of the opening at the tip of the penis Circumcised men seem to have slightly lower rates of: ?Urinary tract infections ?Swelling of the opening at the tip of the penis ?Penis  cancer ?HIV and other infections that you catch during sex ?Cervical cancer in the women they have sex with Even so, in the [place], the risks of these problems are small - even in boys and men who have not been circumcised. Plus, boys and men who are not circumcised can reduce these extra risks by: ?Cleaning their penis well ?Using condoms during sex  What are the risks of circumcision?  Risks include: ?Bleeding or infection from the surgery ?Damage to or amputation of the penis ?A chance that the doctor will cut off too much or not enough of the foreskin ?A chance that sex won't feel as good later in life Only about 1 out of  every 200 circumcisions leads to problems. There is also a chance that your health insurance won't pay for circumcision.  How is circumcision done in baby boys?  First, the baby gets medicine for pain relief. This might be a cream on the skin or a shot into the base of the penis. Next, the doctor cleans the baby's penis well. Then he or she uses special tools to cut off the foreskin. Finally, the doctor wraps a bandage (called gauze) around the baby's penis. If you have your baby circumcised, his doctor or nurse will give you instructions on how to care for him after the surgery. It is important that you follow those instructions carefully.

## 2019-07-12 NOTE — L&D Delivery Note (Signed)
OB/GYN Faculty Practice Delivery Note  Alicia Rich is a 24 y.o. G2P1001 s/p VBAC at [redacted]w[redacted]d. She was admitted for active labor.   ROM: 1h 37m with clear fluid GBS Status: Negative/-- (02/10 1140) Maximum Maternal Temperature: 98.62F  Labor Progress: . Initial SVE: 8/90/0. AROM performed and patient progressed to complete. She then progressed to complete.   Delivery Date/Time: 2/28 @ 0756 Delivery: Called to room and patient was complete and pushing. Head delivered in LOA position. No nuchal cord present. Shoulder and body delivered in usual fashion. Infant with spontaneous cry, placed on mother's abdomen, dried and stimulated. Cord clamped x 2 after 1-minute delay, and cut by FOB. Cord blood drawn. Placenta delivered spontaneously with gentle cord traction. Fundus not firm, Pitocin and Methergine given. Labia, perineum, vagina, and cervix inspected inspected with left and right labial and peri-clitoral which were repaired with 4-0 and 3-0 Vicryl respectively.  Baby Weight: pending  Placenta: Sent to L&D Complications: None Lacerations: left and right labial and peri-clitoral EBL: 500 mL Analgesia: Lidocaine for repair  Infant: APGAR (1 MIN): 8   APGAR (5 MINS): 9   APGAR (10 MINS):     Jerilynn Birkenhead, MD [facility] Family Medicine Fellow, [facility], [facility] 09/08/2019, 8:48 AM

## 2019-07-24 ENCOUNTER — Other Ambulatory Visit: Payer: Self-pay

## 2019-07-24 ENCOUNTER — Telehealth (INDEPENDENT_AMBULATORY_CARE_PROVIDER_SITE_OTHER): Payer: BC Managed Care – PPO | Admitting: Family Medicine

## 2019-07-24 DIAGNOSIS — O34219 Maternal care for unspecified type scar from previous cesarean delivery: Secondary | ICD-10-CM

## 2019-07-24 DIAGNOSIS — Z3491 Encounter for supervision of normal pregnancy, unspecified, first trimester: Secondary | ICD-10-CM

## 2019-07-24 DIAGNOSIS — Z3A32 32 weeks gestation of pregnancy: Secondary | ICD-10-CM

## 2019-07-24 NOTE — Progress Notes (Signed)
I connected with@ on 07/24/19 at 11:30 AM EST by: MyChart and verified that I am speaking with the correct person using two identifiers.  Patient is located at in her car and provider is located at [facility].     The purpose of this virtual visit is to provide medical care while limiting exposure to the novel coronavirus. I discussed the limitations, risks, security and privacy concerns of performing an evaluation and management service by MyChart and the availability of in person appointments. I also discussed with the patient that there may be a patient responsible charge related to this service. By engaging in this virtual visit, you consent to the provision of healthcare.  Additionally, you authorize for your insurance to be billed for the services provided during this visit.  The patient expressed understanding and agreed to proceed.  The following staff members participated in the virtual visit:  Scheryl Marten    PRENATAL VISIT NOTE  Subjective:  Alicia Rich is a 24 y.o. G2P1001 at [redacted]w[redacted]d  for phone visit for ongoing prenatal care.  She is currently monitored for the following issues for this low-risk pregnancy and has Previous cesarean section complicating pregnancy, antepartum condition or complication; Encounter for supervision of low-risk pregnancy in first trimester; and Asymptomatic bacteriuria during pregnancy on their problem list.  Patient reports no complaints.  Contractions: Irritability. Vag. Bleeding: None.  Movement: Present. Denies leaking of fluid.   The following portions of the patient's history were reviewed and updated as appropriate: allergies, current medications, past family history, past medical history, past social history, past surgical history and problem list.   Objective:  There were no vitals filed for this visit. Self-Obtained  Fetal Status:     Movement: Present     Assessment and Plan:  Pregnancy: G2P1001 at [redacted]w[redacted]d  1. Previous cesarean section  complicating pregnancy, antepartum condition or complication Discussed TOLAc last visit Given information at last visit Patient has decided to pursue RCS Desires family of 3 children   2. Encounter for supervision of low-risk pregnancy in first trimester Up to date Reviewed pregnancy complaints and typical counseling given   Preterm labor symptoms and general obstetric precautions including but not limited to vaginal bleeding, contractions, leaking of fluid and fetal movement were reviewed in detail with the patient.  Return in about 2 weeks (around 08/07/2019) for Routine prenatal care, Telehealth/Virtual health OB Visit.  No future appointments.   Time spent on virtual visit: 10 minutes  Federico Flake, MD

## 2019-07-24 NOTE — Progress Notes (Signed)
I connected with  Su Grand on 07/24/19 at 11:30 AM EST by telephone and verified that I am speaking with the correct person using two identifiers.   I discussed the limitations, risks, security and privacy concerns of performing an evaluation and management service by telephone and the availability of in person appointments. I also discussed with the patient that there may be a patient responsible charge related to this service. The patient expressed understanding and agreed to proceed.  Scheryl Marten, RN 07/24/2019  11:54 AM

## 2019-08-07 ENCOUNTER — Other Ambulatory Visit: Payer: Self-pay

## 2019-08-07 ENCOUNTER — Encounter: Payer: Self-pay | Admitting: Family Medicine

## 2019-08-07 ENCOUNTER — Telehealth (INDEPENDENT_AMBULATORY_CARE_PROVIDER_SITE_OTHER): Payer: BC Managed Care – PPO | Admitting: Family Medicine

## 2019-08-07 DIAGNOSIS — Z3491 Encounter for supervision of normal pregnancy, unspecified, first trimester: Secondary | ICD-10-CM

## 2019-08-07 DIAGNOSIS — Z3A34 34 weeks gestation of pregnancy: Secondary | ICD-10-CM

## 2019-08-07 DIAGNOSIS — O99891 Other specified diseases and conditions complicating pregnancy: Secondary | ICD-10-CM

## 2019-08-07 DIAGNOSIS — R8271 Bacteriuria: Secondary | ICD-10-CM

## 2019-08-07 DIAGNOSIS — O34219 Maternal care for unspecified type scar from previous cesarean delivery: Secondary | ICD-10-CM

## 2019-08-07 NOTE — Progress Notes (Signed)
I connected with  Alicia Rich on 08/07/19 at 10:00 AM EST by telephone and verified that I am speaking with the correct person using two identifiers.   I discussed the limitations, risks, security and privacy concerns of performing an evaluation and management service by telephone and the availability of in person appointments. I also discussed with the patient that there may be a patient responsible charge related to this service. The patient expressed understanding and agreed to proceed.  Neda Willenbring Emeline Darling, CMA 08/07/2019  10:28 AM01/13/2021 - 103/64 P 96

## 2019-08-07 NOTE — Progress Notes (Signed)
I connected with@ on 08/07/19 at 10:00 AM EST by: Mychart and verified that I am speaking with the correct person using two identifiers.  Patient is located at [employer]- running errands and provider is located at [facility].     The purpose of this virtual visit is to provide medical care while limiting exposure to the novel coronavirus. I discussed the limitations, risks, security and privacy concerns of performing an evaluation and management service by Mychart and the availability of in person appointments. I also discussed with the patient that there may be a patient responsible charge related to this service. By engaging in this virtual visit, you consent to the provision of healthcare.  Additionally, you authorize for your insurance to be billed for the services provided during this visit.  The patient expressed understanding and agreed to proceed.  The following staff members participated in the virtual visit:  Scheryl Marten    PRENATAL VISIT NOTE  Subjective:  Alicia Rich is a 24 y.o. G2P1001 at [redacted]w[redacted]d  for phone visit for ongoing prenatal care.  She is currently monitored for the following issues for this low-risk pregnancy and has Previous cesarean section complicating pregnancy, antepartum condition or complication; Encounter for supervision of low-risk pregnancy in first trimester; and Asymptomatic bacteriuria during pregnancy on their problem list.  Patient reports no complaints.  Contractions: Not present.  .  Movement: Present. Denies leaking of fluid.   The following portions of the patient's history were reviewed and updated as appropriate: allergies, current medications, past family history, past medical history, past social history, past surgical history and problem list.   Objective:  There were no vitals filed for this visit. Self-Obtained  Fetal Status:     Movement: Present     Assessment and Plan:  Pregnancy: G2P1001 at [redacted]w[redacted]d 1. Previous cesarean section complicating  pregnancy, antepartum condition or complication Scheduled for CS on 2/28  2. Asymptomatic bacteriuria during pregnancy TOC neg  3. Encounter for supervision of low-risk pregnancy in first trimester Up to date No concerns today but is worried baby is again breech. Will check at next visit Knows about vaginal testing next visit    Preterm labor symptoms and general obstetric precautions including but not limited to vaginal bleeding, contractions, leaking of fluid and fetal movement were reviewed in detail with the patient.  No follow-ups on file.  Future Appointments  Date Time Provider Department Center  08/21/2019 11:15 AM Federico Flake, MD [facility]     Time spent on virtual visit: 10 minutes  Federico Flake, MD

## 2019-08-10 ENCOUNTER — Other Ambulatory Visit: Payer: Self-pay

## 2019-08-10 ENCOUNTER — Inpatient Hospital Stay (HOSPITAL_COMMUNITY)
Admission: AD | Admit: 2019-08-10 | Discharge: 2019-08-10 | Disposition: A | Discharge status: Home / Self Care | Payer: BC Managed Care – PPO | Attending: Obstetrics & Gynecology | Admitting: Obstetrics & Gynecology

## 2019-08-10 ENCOUNTER — Encounter (HOSPITAL_COMMUNITY): Payer: Self-pay | Admitting: Obstetrics & Gynecology

## 2019-08-10 DIAGNOSIS — Z3A35 35 weeks gestation of pregnancy: Secondary | ICD-10-CM | POA: Diagnosis not present

## 2019-08-10 DIAGNOSIS — O34219 Maternal care for unspecified type scar from previous cesarean delivery: Secondary | ICD-10-CM | POA: Diagnosis not present

## 2019-08-10 LAB — URINALYSIS, ROUTINE W REFLEX MICROSCOPIC
Bilirubin Urine: NEGATIVE
Glucose, UA: NEGATIVE mg/dL
Hgb urine dipstick: NEGATIVE
Ketones, ur: NEGATIVE mg/dL
Leukocytes,Ua: NEGATIVE
Nitrite: NEGATIVE
Protein, ur: NEGATIVE mg/dL
Specific Gravity, Urine: 1.021 (ref 1.005–1.030)
pH: 6 (ref 5.0–8.0)

## 2019-08-10 LAB — ABO/RH: ABO/RH(D): O POS

## 2019-08-10 LAB — CBC
HCT: 37 % (ref 36.0–46.0)
Hemoglobin: 11.8 g/dL — ABNORMAL LOW (ref 12.0–15.0)
MCH: 29.3 pg (ref 26.0–34.0)
MCHC: 31.9 g/dL (ref 30.0–36.0)
MCV: 91.8 fL (ref 80.0–100.0)
Platelets: 369 10*3/uL (ref 150–400)
RBC: 4.03 MIL/uL (ref 3.87–5.11)
RDW: 12.2 % (ref 11.5–15.5)
WBC: 11.9 10*3/uL — ABNORMAL HIGH (ref 4.0–10.5)
nRBC: 0 % (ref 0.0–0.2)

## 2019-08-10 LAB — WET PREP, GENITAL
Clue Cells Wet Prep HPF POC: NONE SEEN
Sperm: NONE SEEN
Trich, Wet Prep: NONE SEEN
Yeast Wet Prep HPF POC: NONE SEEN

## 2019-08-10 LAB — TYPE AND SCREEN
ABO/RH(D): O POS
Antibody Screen: NEGATIVE

## 2019-08-10 LAB — AMNISURE RUPTURE OF MEMBRANE (ROM) NOT AT ARMC: Amnisure ROM: NEGATIVE

## 2019-08-10 MED ORDER — LACTATED RINGERS IV BOLUS
1000.0000 mL | Freq: Once | INTRAVENOUS | Status: AC
  Administered 2019-08-10: 12:00:00 1000 mL via INTRAVENOUS

## 2019-08-10 MED ORDER — NIFEDIPINE 10 MG PO CAPS
10.0000 mg | ORAL_CAPSULE | ORAL | Status: AC | PRN
  Administered 2019-08-10 (×3): 10 mg via ORAL
  Filled 2019-08-10 (×4): qty 1

## 2019-08-10 MED ORDER — LACTATED RINGERS IV BOLUS
1000.0000 mL | Freq: Once | INTRAVENOUS | Status: AC
  Administered 2019-08-10: 1000 mL via INTRAVENOUS

## 2019-08-10 NOTE — Discharge Instructions (Signed)

## 2019-08-10 NOTE — MAU Provider Note (Signed)
History    Patient Alicia Rich is a 24 y.o. G2P1001 At [redacted]w[redacted]d here with complaints of contractions that started this morning around 8 am. She denies vaginal bleeding, decreased fetal movements, blurry vision, floating spots, RUQ pain. She reports some fluid leakage overnight but is not sure if it was urine , as it happened when she was using the bathroom in the middle of the night.   She is scheduled for repeat c-section at 39 weeks (first c-section was for breech).  CSN: [id number]  Arrival date and time: 08/10/19 1027   First Provider Initiated Contact with Patient 08/10/19 1142      Chief Complaint  Patient presents with  . Contractions   Abdominal Pain This is a new problem. The current episode started yesterday. The problem occurs intermittently. The problem has been resolved. The pain is at a severity of 0/10 (when she first felt it at 8 it was a 9/10, but now she "barely feels it". ). The quality of the pain is cramping. The abdominal pain radiates to the back. Associated symptoms include headaches. Pertinent negatives include no constipation, nausea or vomiting. Associated symptoms comments: Small HA now. Exacerbated by: walking.   She started feeling pain from 9  To 11 yesterday morning, and then it went away. Overnight she had a gush of fluid that came from either vagina or bladder at 1 am; it was "weird" and she never experienced it before. She thinks she has some drops since then. She mentions this because her mother in law said to mention it.  She doesn't feel the contractions now.   OB History    Gravida  2   Para  1   Term  1   Preterm  0   AB  0   Living  1     SAB  0   TAB  0   Ectopic  0   Multiple  0   Live Births  1           Past Medical History:  Diagnosis Date  . Medical history non-contributory     Past Surgical History:  Procedure Laterality Date  . CESAREAN SECTION N/A 04/14/2018   Procedure: CESAREAN SECTION;  Surgeon: Woodroe Mode, MD;  Location: [place];  Service: Obstetrics;  Laterality: N/A;  . NO PAST SURGERIES      Family History  Problem Relation Age of Onset  . Heart attack Maternal Grandfather   . Heart disease Maternal Grandfather   . Heart disease Paternal Grandfather   . Stroke Paternal Grandfather     Social History   Tobacco Use  . Smoking status: Never Smoker  . Smokeless tobacco: Never Used  Substance Use Topics  . Alcohol use: No  . Drug use: No    Allergies: No Known Allergies  Medications Prior to Admission  Medication Sig Dispense Refill Last Dose  . Blood Pressure Monitoring (BLOOD PRESSURE CUFF) MISC 1 Device by Does not apply route once a week. To be monitored weekly from home 1 each 0 Past Week at Unknown time  . Prenatal Vit-Fe Fumarate-FA (PRENATAL MULTIVITAMIN) TABS tablet Take 1 tablet by mouth daily.    08/09/2019 at Unknown time  . ampicillin (PRINCIPEN) 500 MG capsule Take 1 capsule (500 mg total) by mouth 4 (four) times daily. (Patient not taking: Reported on 06/26/2019) 28 capsule 0     Review of Systems  Constitutional: Negative.   HENT: Negative.   Respiratory: Negative.  Gastrointestinal: Positive for abdominal pain. Negative for constipation, nausea and vomiting.  Genitourinary: Negative.   Neurological: Positive for headaches.   Physical Exam   Blood pressure 110/61, pulse 83, temperature 98.4 F (36.9 C), temperature source Oral, resp. rate 16, height 5' (1.524 m), weight 60 kg, last menstrual period 12/08/2018, SpO2 100 %, unknown if currently breastfeeding.  Physical Exam  Constitutional: She is oriented to person, place, and time. She appears well-developed.  HENT:  Head: Normocephalic.  Eyes: Pupils are equal, round, and reactive to light.  Respiratory: Effort normal.  GI: Soft.  Genitourinary:    Genitourinary Comments: NEFG; no pooling in the vagina, Cervix is 1.5 cm, 50%, posterior.    Musculoskeletal:        General: Normal  range of motion.     Cervical back: Normal range of motion.  Neurological: She is alert and oriented to person, place, and time.  Skin: Skin is warm and dry.    MAU Course  Procedures  MDM --IV fluids and admission labs drawn; will also draw wet prep and gc  -amnisure negative  -wet prep negative  -G probe pending -procardia series ordered. Patient felt better after 2 doses of procardia; BP was too low for third dose but patient felt better after two doses. Cervix unchanged over 2+ hours of monitoring.   -NST: 140 bpm, mod var, present acel, neg decels, q 2-5 and patient does not feel them, palpate mild.   Assessment and Plan   1. Preterm labor in third trimester without delivery   2. Previous cesarean section complicating pregnancy, antepartum condition or complication    2. Patient stable for discharge with strict return precautions; plan to follow up at appt on 08/21/2019.   Charlesetta Garibaldi Khloee Garza 08/10/2019, 11:49 AM

## 2019-08-10 NOTE — MAU Note (Signed)
Alicia Rich is a 24 y.o. at [redacted]w[redacted]d here in MAU reporting: was having bad cramping yesterday, called the office and was told to come in but she opted to stay home and rest and took some tylenol. Today the cramping is back and comes every 7-8 minutes. No bleeding, unsure about LOF, states she has a lot of discharge and felt a gush over night. +FM  Onset of complaint: yesterday  Pain score: 4/10  Vitals:   08/10/19 1038  BP: 111/63  Pulse: 90  Resp: 16  Temp: 98.4 F (36.9 C)  SpO2: 100%     FHT: +FM  Lab orders placed from triage: UA

## 2019-08-11 LAB — RPR: RPR Ser Ql: NONREACTIVE

## 2019-08-12 ENCOUNTER — Encounter (HOSPITAL_COMMUNITY): Payer: Self-pay | Admitting: Obstetrics & Gynecology

## 2019-08-12 ENCOUNTER — Inpatient Hospital Stay (HOSPITAL_COMMUNITY)
Admission: AD | Admit: 2019-08-12 | Discharge: 2019-08-13 | Disposition: A | Discharge status: Home / Self Care | Payer: BC Managed Care – PPO | Attending: Obstetrics & Gynecology | Admitting: Obstetrics & Gynecology

## 2019-08-12 ENCOUNTER — Other Ambulatory Visit: Payer: Self-pay

## 2019-08-12 ENCOUNTER — Telehealth: Payer: Self-pay | Admitting: Radiology

## 2019-08-12 DIAGNOSIS — R109 Unspecified abdominal pain: Secondary | ICD-10-CM | POA: Diagnosis not present

## 2019-08-12 DIAGNOSIS — O4703 False labor before 37 completed weeks of gestation, third trimester: Secondary | ICD-10-CM | POA: Diagnosis not present

## 2019-08-12 DIAGNOSIS — Z3A35 35 weeks gestation of pregnancy: Secondary | ICD-10-CM | POA: Diagnosis not present

## 2019-08-12 DIAGNOSIS — O479 False labor, unspecified: Secondary | ICD-10-CM

## 2019-08-12 DIAGNOSIS — O368130 Decreased fetal movements, third trimester, not applicable or unspecified: Secondary | ICD-10-CM | POA: Diagnosis present

## 2019-08-12 DIAGNOSIS — Z3689 Encounter for other specified antenatal screening: Secondary | ICD-10-CM

## 2019-08-12 DIAGNOSIS — O4700 False labor before 37 completed weeks of gestation, unspecified trimester: Secondary | ICD-10-CM

## 2019-08-12 LAB — GC/CHLAMYDIA PROBE AMP (~~LOC~~) NOT AT ARMC
Chlamydia: NEGATIVE
Comment: NEGATIVE
Comment: NORMAL
Neisseria Gonorrhea: NEGATIVE

## 2019-08-12 LAB — URINALYSIS, ROUTINE W REFLEX MICROSCOPIC
Bilirubin Urine: NEGATIVE
Glucose, UA: NEGATIVE mg/dL
Hgb urine dipstick: NEGATIVE
Ketones, ur: NEGATIVE mg/dL
Leukocytes,Ua: NEGATIVE
Nitrite: NEGATIVE
Protein, ur: NEGATIVE mg/dL
Specific Gravity, Urine: 1.019 (ref 1.005–1.030)
pH: 6 (ref 5.0–8.0)

## 2019-08-12 MED ORDER — ONDANSETRON 4 MG PO TBDP
8.0000 mg | ORAL_TABLET | Freq: Once | ORAL | Status: AC
  Administered 2019-08-12: 8 mg via ORAL
  Filled 2019-08-12: qty 2

## 2019-08-12 MED ORDER — BETAMETHASONE SOD PHOS & ACET 6 (3-3) MG/ML IJ SUSP
12.0000 mg | Freq: Once | INTRAMUSCULAR | Status: AC
  Administered 2019-08-12: 20:00:00 12 mg via INTRAMUSCULAR
  Filled 2019-08-12: qty 5

## 2019-08-12 NOTE — MAU Provider Note (Addendum)
History    Chief Complaint  Patient presents with  . Decreased Fetal Movement  . Contractions   Alicia Rich is a 24 yo G2P1 at [redacted]w[redacted]d presenting today for ctx. She reports that ctx began on Friday, 1/29, and became worse on Saturday when she came to MAU for evaluation. At that point in time, her CVE was 1.5/50/-3, wet prep WNL, GC/CT negative, amnisure negative. She reports that the ctx have not completely gone away since they started on Friday, but were tolerable, a 4/10 pain, until today. Today ctx have been in her low back and worse pain was 9/10 and patient wasn't able to talk through the pain. She denies LOF, VB, and discharge. Since presenting to MAU, her ctx have become more tolerable again, 4/10, and are very intermittent and spaced out on monitor. CVE today 2/70/-2 with fetal head well applied to cervix.   Past Medical History:  Diagnosis Date  . Medical history non-contributory     Past Surgical History:  Procedure Laterality Date  . CESAREAN SECTION N/A 04/14/2018   Procedure: CESAREAN SECTION;  Surgeon: Alicia Phenix, MD;  Location: [facility] BIRTHING SUITES;  Service: Obstetrics;  Laterality: N/A;  . NO PAST SURGERIES      Family History  Problem Relation Age of Onset  . Heart attack Maternal Grandfather   . Heart disease Maternal Grandfather   . Heart disease Paternal Grandfather   . Stroke Paternal Grandfather     Social History   Tobacco Use  . Smoking status: Never Smoker  . Smokeless tobacco: Never Used  Substance Use Topics  . Alcohol use: No  . Drug use: No    Allergies: No Known Allergies  Medications Prior to Admission  Medication Sig Dispense Refill Last Dose  . Blood Pressure Monitoring (BLOOD PRESSURE CUFF) MISC 1 Device by Does not apply route once a week. To be monitored weekly from home 1 each 0 Past Week at Unknown time  . Prenatal Vit-Fe Fumarate-FA (PRENATAL MULTIVITAMIN) TABS tablet Take 1 tablet by mouth daily.    08/12/2019 at Unknown time     Review of Systems  Constitutional: Negative for chills, diaphoresis, fatigue and fever.  Eyes: Negative for visual disturbance.  Respiratory: Negative for shortness of breath.   Cardiovascular: Negative for chest pain.  Gastrointestinal: Positive for abdominal pain (ctx). Negative for constipation, diarrhea, nausea and vomiting.  Genitourinary: Negative for dysuria, flank pain, frequency, pelvic pain, urgency, vaginal bleeding, vaginal discharge and vaginal pain.       Ctx  Neurological: Negative for dizziness, weakness, light-headedness and headaches.   Physical Exam Blood pressure (!) 113/55, pulse 88, temperature 98.1 F (36.7 C), temperature source Oral, resp. rate 16, height 5' (1.524 m), weight 60.6 kg, last menstrual period 12/08/2018, SpO2 100 %, unknown if currently breastfeeding.   Patient Vitals for the past 24 hrs:  BP Temp Temp src Pulse Resp SpO2 Height Weight  08/12/19 2210 (!) 113/55 - - 88 - - - -  08/12/19 1926 113/64 98.1 F (36.7 C) Oral 99 16 - - -  08/12/19 1909 129/80 98.7 F (37.1 C) Oral (!) 108 18 100 % 5' (1.524 m) 60.6 kg   Physical Exam  Nursing note and vitals reviewed. Constitutional: She is oriented to person, place, and time. She appears well-developed and well-nourished.  HENT:  Head: Normocephalic and atraumatic.  Eyes: Conjunctivae are normal.  Cardiovascular: Normal rate, regular rhythm and normal heart sounds. Exam reveals no gallop and no friction rub.  No  murmur heard. Respiratory: Effort normal and breath sounds normal. No respiratory distress. She has no wheezes. She has no rales.  GI: Soft. Bowel sounds are normal. She exhibits no distension. There is no abdominal tenderness.  Genitourinary:    Genitourinary Comments: PELVIC:  Normal appearing external female genitalia, normal vaginal epithelium, no abnormal discharge. CVE: 2/70/-2    Musculoskeletal:        General: No edema.  Neurological: She is alert and oriented to person,  place, and time. No cranial nerve deficit.  Skin: Skin is warm and dry.  Psychiatric: She has a normal mood and affect. Her behavior is normal.    MAU Course [name] presents for a labor check with irregular ctx and minimal cervical change. Will give BMZ tonight, and observe for two hours. If no CVE change or increase in frequency/regularity of ctx, patient can be discharged home and received second dose of BMZ tomorrow with close follow up in clinic.  Alicia Damme, MD [facility], PGY-1  Checked on patient @ 2200- patient having Nausea and not feeling well right now.  Cervix 1.5/2, 80%, -2, bloody show. Will continue to monitor- patient lives 20 mins away from hospital  Zofran 8 mg given PO ODT Report given to Alicia Jefferson NP who resumes care of the patient.  -N/V resolved after Zofran -cervix unchanged on repeat check, bloody show vs. Multiple cervical exams -pt denies any DFM, fetal movement felt during exam -pt reports ctx have stopped and denies pain at this time -EFM: reactive       -baseline: 130       -variability: moderate       -accels: present, 15x15       -decels: absent       -TOCO: few, irregular ctx -pt discharged to home in stable condition  Assessment and Plan  1. Preterm contractions   2. [redacted] weeks gestation of pregnancy   3. NST (non-stress test) reactive    Allergies as of 08/13/2019   No Known Allergies     Medication List    TAKE these medications   Blood Pressure Cuff Misc 1 Device by Does not apply route once a week. To be monitored weekly from home   prenatal multivitamin Tabs tablet Take 1 tablet by mouth daily.      -pt to return to MAU tomorrow for 2nd BMZ -strict PTL/return MAU precautions given -pt discharged to home in stable condition  Alicia Rich, Gerrie Nordmann, NP  12:30 AM 08/13/2019

## 2019-08-12 NOTE — MAU Note (Signed)
Patient presents to MAU c/o DFM, back pain, and ctx.  Patient reports only feeling 3 fetal movements all day long.  Denies vaginal bleeding or LOF.

## 2019-08-12 NOTE — Telephone Encounter (Signed)
Patient called stating that she was seen at MAU over the weekend for preterm labor. Stated that she was instructed the call office if her contractions started back, she states that they are now in her back and contracting every 6-7 minutes. I spoke with Rn who stated that patient should return to MAU is contractions are more consistent and regular so that she can be checked for cervical change. Patient expressed understanding and stated that she is going to wait til contractions get to every 5 minutes consistently and will go to MAU

## 2019-08-13 ENCOUNTER — Inpatient Hospital Stay (EMERGENCY_DEPARTMENT_HOSPITAL)
Admission: AD | Admit: 2019-08-13 | Discharge: 2019-08-13 | Disposition: A | Discharge status: Home / Self Care | Payer: BC Managed Care – PPO | Source: Home / Self Care | Attending: Family Medicine | Admitting: Family Medicine

## 2019-08-13 ENCOUNTER — Other Ambulatory Visit: Payer: Self-pay

## 2019-08-13 ENCOUNTER — Encounter (HOSPITAL_COMMUNITY): Payer: Self-pay | Admitting: Family Medicine

## 2019-08-13 DIAGNOSIS — Z3A35 35 weeks gestation of pregnancy: Secondary | ICD-10-CM

## 2019-08-13 DIAGNOSIS — O368130 Decreased fetal movements, third trimester, not applicable or unspecified: Secondary | ICD-10-CM | POA: Insufficient documentation

## 2019-08-13 DIAGNOSIS — O4703 False labor before 37 completed weeks of gestation, third trimester: Secondary | ICD-10-CM | POA: Diagnosis not present

## 2019-08-13 DIAGNOSIS — R109 Unspecified abdominal pain: Secondary | ICD-10-CM | POA: Insufficient documentation

## 2019-08-13 LAB — URINALYSIS, ROUTINE W REFLEX MICROSCOPIC
Bilirubin Urine: NEGATIVE
Glucose, UA: NEGATIVE mg/dL
Hgb urine dipstick: NEGATIVE
Ketones, ur: 20 mg/dL — AB
Nitrite: NEGATIVE
Protein, ur: NEGATIVE mg/dL
Specific Gravity, Urine: 1.029 (ref 1.005–1.030)
pH: 5 (ref 5.0–8.0)

## 2019-08-13 MED ORDER — BETAMETHASONE SOD PHOS & ACET 6 (3-3) MG/ML IJ SUSP
12.0000 mg | Freq: Once | INTRAMUSCULAR | Status: AC
  Administered 2019-08-13: 12 mg via INTRAMUSCULAR

## 2019-08-13 NOTE — Discharge Instructions (Signed)
Preterm Labor and Birth Information ° °The normal length of a pregnancy is 39-41 weeks. Preterm labor is when labor starts before 37 completed weeks of pregnancy. °What are the risk factors for preterm labor? °Preterm labor is more likely to occur in women who: °· Have certain infections during pregnancy such as a bladder infection, sexually transmitted infection, or infection inside the uterus (chorioamnionitis). °· Have a shorter-than-normal cervix. °· Have gone into preterm labor before. °· Have had surgery on their cervix. °· Are younger than age 17 or older than age 35. °· Are African American. °· Are pregnant with twins or multiple babies (multiple gestation). °· Take street drugs or smoke while pregnant. °· Do not gain enough weight while pregnant. °· Became pregnant shortly after having been pregnant. °What are the symptoms of preterm labor? °Symptoms of preterm labor include: °· Cramps similar to those that can happen during a menstrual period. The cramps may happen with diarrhea. °· Pain in the abdomen or lower back. °· Regular uterine contractions that may feel like tightening of the abdomen. °· A feeling of increased pressure in the pelvis. °· Increased watery or bloody mucus discharge from the vagina. °· Water breaking (ruptured amniotic sac). °Why is it important to recognize signs of preterm labor? °It is important to recognize signs of preterm labor because babies who are born prematurely may not be fully developed. This can put them at an increased risk for: °· Long-term (chronic) heart and lung problems. °· Difficulty immediately after birth with regulating body systems, including blood sugar, body temperature, heart rate, and breathing rate. °· Bleeding in the brain. °· Cerebral palsy. °· Learning difficulties. °· Death. °These risks are highest for babies who are born before 34 weeks of pregnancy. °How is preterm labor treated? °Treatment depends on the length of your pregnancy, your condition,  and the health of your baby. It may involve: °· Having a stitch (suture) placed in your cervix to prevent your cervix from opening too early (cerclage). °· Taking or being given medicines, such as: °? Hormone medicines. These may be given early in pregnancy to help support the pregnancy. °? Medicine to stop contractions. °? Medicines to help mature the baby’s lungs. These may be prescribed if the risk of delivery is high. °? Medicines to prevent your baby from developing cerebral palsy. °If the labor happens before 34 weeks of pregnancy, you may need to stay in the hospital. °What should I do if I think I am in preterm labor? °If you think that you are going into preterm labor, call your health care provider right away. °How can I prevent preterm labor in future pregnancies? °To increase your chance of having a full-term pregnancy: °· Do not use any tobacco products, such as cigarettes, chewing tobacco, and e-cigarettes. If you need help quitting, ask your health care provider. °· Do not use street drugs or medicines that have not been prescribed to you during your pregnancy. °· Talk with your health care provider before taking any herbal supplements, even if you have been taking them regularly. °· Make sure you gain a healthy amount of weight during your pregnancy. °· Watch for infection. If you think that you might have an infection, get it checked right away. °· Make sure to tell your health care provider if you have gone into preterm labor before. °This information is not intended to replace advice given to you by your health care provider. Make sure you discuss any questions you have with your   health care provider. °Document Revised: 10/19/2018 Document Reviewed: 11/18/2015 °Elsevier Patient Education © 2020 Elsevier Inc. ° °

## 2019-08-13 NOTE — MAU Note (Signed)
Patient reports to MAU c/o continued ctx today as well as DFM today. Pt reports she has only felt him move 4 times today in total. Pt reports continued back pain as well. Pt reports today she felt like her vagina tore. Pt also is c/o egg like taste in her mouth and she has not eaten eggs.

## 2019-08-13 NOTE — MAU Provider Note (Signed)
Chief Complaint:  Abdominal Pain, Back Pain, Contractions, Decreased Fetal Movement, and Injections   First Provider Initiated Contact with Patient 08/13/19 2007      HPI: Alicia Rich is a 24 y.o. G2P1001 at [redacted]w[redacted]d who presents to maternity admissions for second BMZ injection. Patient was here yesterday for preterm contractions where she did not have cervical change over observation period. Received Procardia but blood pressure dropped although patient asymptomatic so they did not give her more. Reports infrequent ctx over the last 24 hours. Reports that ctx are worse during the day and calm at night. Currently not feeling ctx. Reports normal PO intake.  She reports good fetal movement, denies LOF, vaginal bleeding, vaginal itching/burning, urinary symptoms, h/a, dizziness, n/v, or fever/chills.    Past Medical History: Past Medical History:  Diagnosis Date  . Medical history non-contributory     Past obstetric history: OB History  Gravida Para Term Preterm AB Living  2 1 1  0 0 1  SAB TAB Ectopic Multiple Live Births  0 0 0 0 1    # Outcome Date GA Lbr Len/2nd Weight Sex Delivery Anes PTL Lv  2 Current           1 Term 04/14/18 [redacted]w[redacted]d  2750 g F CS-LTranv  N LIV     Complications: Breech presentation, antepartum    Past Surgical History: Past Surgical History:  Procedure Laterality Date  . CESAREAN SECTION N/A 04/14/2018   Procedure: CESAREAN SECTION;  Surgeon: 06/14/2018, MD;  Location: [facility] BIRTHING SUITES;  Service: Obstetrics;  Laterality: N/A;  . NO PAST SURGERIES      Family History: Family History  Problem Relation Age of Onset  . Heart attack Maternal Grandfather   . Heart disease Maternal Grandfather   . Heart disease Paternal Grandfather   . Stroke Paternal Grandfather     Social History: Social History   Tobacco Use  . Smoking status: Never Smoker  . Smokeless tobacco: Never Used  Substance Use Topics  . Alcohol use: No  . Drug use: No     Allergies: No Known Allergies  Meds:  Medications Prior to Admission  Medication Sig Dispense Refill Last Dose  . Prenatal Vit-Fe Fumarate-FA (PRENATAL MULTIVITAMIN) TABS tablet Take 1 tablet by mouth daily.    08/13/2019 at Unknown time  . Blood Pressure Monitoring (BLOOD PRESSURE CUFF) MISC 1 Device by Does not apply route once a week. To be monitored weekly from home 1 each 0     ROS:  Review of Systems All other systems negative unless noted above in HPI.   I have reviewed patient's Past Medical Hx, Surgical Hx, Family Hx, Social Hx, medications and allergies.   Physical Exam   Patient Vitals for the past 24 hrs:  BP Temp Temp src Pulse Resp  08/13/19 2001 130/80 98.1 F (36.7 C) Oral (!) 112 16   Constitutional: Well-developed, well-nourished female in no acute distress.  Cardiovascular: mild tachycardia  Respiratory: normal effort GI: Abd soft, non-tender, gravid appropriate for gestational age.  MS: Extremities nontender, no edema, normal ROM Neurologic: Alert and oriented x 4.  Bimanual exam: neg CMT, uterus nontender, nonenlarged, adnexa without tenderness, enlargement, or mass  Dilation: 1.5 Effacement (%): 60 Station: -2 Exam by:: [email or web address] MD  FHT:  Baseline 145, moderate variability, accelerations present, no decelerations Contractions: infrequent   Labs: Results for orders placed or performed during the hospital encounter of 08/13/19 (from the past 24 hour(s))  Urinalysis, Routine w reflex microscopic  Status: Abnormal   Collection Time: 08/13/19  7:55 PM  Result Value Ref Range   Color, Urine YELLOW YELLOW   APPearance HAZY (A) CLEAR   Specific Gravity, Urine 1.029 1.005 - 1.030   pH 5.0 5.0 - 8.0   Glucose, UA NEGATIVE NEGATIVE mg/dL   Hgb urine dipstick NEGATIVE NEGATIVE   Bilirubin Urine NEGATIVE NEGATIVE   Ketones, ur 20 (A) NEGATIVE mg/dL   Protein, ur NEGATIVE NEGATIVE mg/dL   Nitrite NEGATIVE NEGATIVE   Leukocytes,Ua SMALL (A)  NEGATIVE   RBC / HPF 0-5 0 - 5 RBC/hpf   WBC, UA 0-5 0 - 5 WBC/hpf   Bacteria, UA RARE (A) NONE SEEN   Squamous Epithelial / LPF 0-5 0 - 5   Mucus PRESENT    --/--/O POS, O POS Performed at [facility] [address] [place], [place] [address]  (01/30 1222)  Imaging:  No results found.  MAU Course/MDM: Orders Placed This Encounter  Procedures  . Urinalysis, Routine w reflex microscopic  . Discharge patient    Meds ordered this encounter  Medications  . betamethasone acetate-betamethasone sodium phosphate (CELESTONE) injection 12 mg     NST reviewed: Reactive TOCO: infrequent Presents for possible preterm labor. Patient received second BMZ. Feeling infrequent ctx but not strong. Cervix unchanged from yesterday.  Of note, patient with hx of C-section due to breech presentation and reports she desires repeat section. Discussed TOLAC and patient possibly interested; encouraged to further discuss in clinic.  Pt discharged with strict return precautions.  Future Appointments  Date Time Provider Person  08/21/2019 11:15 AM Caren Macadam, MD [facility]   Assessment: 1. Preterm uterine contractions in third trimester, antepartum     Plan: Discharge home Labor precautions and fetal kick counts Follow-up [facility] [place] [facility] Follow up.   Specialty: Obstetrics and Gynecology Contact information: [address] [place] [phone]         Allergies as of 08/13/2019   No Known Allergies     Medication List    TAKE these medications   Blood Pressure Cuff Misc 1 Device by Does not apply route once a week. To be monitored weekly from home   prenatal multivitamin Tabs tablet Take 1 tablet by mouth daily.       Barrington Ellison, MD [facility] Family Medicine Fellow, [facility] 08/13/2019 8:31 PM

## 2019-08-13 NOTE — Discharge Instructions (Signed)
Braxton Hicks Contractions Contractions of the uterus can occur throughout pregnancy, but they are not always a sign that you are in labor. You may have practice contractions called Braxton Hicks contractions. These false labor contractions are sometimes confused with true labor. What are Braxton Hicks contractions? Braxton Hicks contractions are tightening movements that occur in the muscles of the uterus before labor. Unlike true labor contractions, these contractions do not result in opening (dilation) and thinning of the cervix. Toward the end of pregnancy (32-34 weeks), Braxton Hicks contractions can happen more often and may become stronger. These contractions are sometimes difficult to tell apart from true labor because they can be very uncomfortable. You should not feel embarrassed if you go to the hospital with false labor. Sometimes, the only way to tell if you are in true labor is for your health care provider to look for changes in the cervix. The health care provider will do a physical exam and may monitor your contractions. If you are not in true labor, the exam should show that your cervix is not dilating and your water has not broken. If there are no other health problems associated with your pregnancy, it is completely safe for you to be sent home with false labor. You may continue to have Braxton Hicks contractions until you go into true labor. How to tell the difference between true labor and false labor True labor  Contractions last 30-70 seconds.  Contractions become very regular.  Discomfort is usually felt in the top of the uterus, and it spreads to the lower abdomen and low back.  Contractions do not go away with walking.  Contractions usually become more intense and increase in frequency.  The cervix dilates and gets thinner. False labor  Contractions are usually shorter and not as strong as true labor contractions.  Contractions are usually irregular.  Contractions  are often felt in the front of the lower abdomen and in the groin.  Contractions may go away when you walk around or change positions while lying down.  Contractions get weaker and are shorter-lasting as time goes on.  The cervix usually does not dilate or become thin. Follow these instructions at home:   Take over-the-counter and prescription medicines only as told by your health care provider.  Keep up with your usual exercises and follow other instructions from your health care provider.  Eat and drink lightly if you think you are going into labor.  If Braxton Hicks contractions are making you uncomfortable: ? Change your position from lying down or resting to walking, or change from walking to resting. ? Sit and rest in a tub of warm water. ? Drink enough fluid to keep your urine pale yellow. Dehydration may cause these contractions. ? Do slow and deep breathing several times an hour.  Keep all follow-up prenatal visits as told by your health care provider. This is important. Contact a health care provider if:  You have a fever.  You have continuous pain in your abdomen. Get help right away if:  Your contractions become stronger, more regular, and closer together.  You have fluid leaking or gushing from your vagina.  You pass blood-tinged mucus (bloody show).  You have bleeding from your vagina.  You have low back pain that you never had before.  You feel your baby's head pushing down and causing pelvic pressure.  Your baby is not moving inside you as much as it used to. Summary  Contractions that occur before labor are   called Braxton Hicks contractions, false labor, or practice contractions.  Braxton Hicks contractions are usually shorter, weaker, farther apart, and less regular than true labor contractions. True labor contractions usually become progressively stronger and regular, and they become more frequent.  Manage discomfort from Braxton Hicks contractions  by changing position, resting in a warm bath, drinking plenty of water, or practicing deep breathing. This information is not intended to replace advice given to you by your health care provider. Make sure you discuss any questions you have with your health care provider. Document Revised: 06/09/2017 Document Reviewed: 11/10/2016 Elsevier Patient Education  2020 Elsevier Inc. Fetal Movement Counts Patient Name: ________________________________________________ Patient Due Date: ____________________ What is a fetal movement count?  A fetal movement count is the number of times that you feel your baby move during a certain amount of time. This may also be called a fetal kick count. A fetal movement count is recommended for every pregnant woman. You may be asked to start counting fetal movements as early as week 28 of your pregnancy. Pay attention to when your baby is most active. You may notice your baby's sleep and wake cycles. You may also notice things that make your baby move more. You should do a fetal movement count:  When your baby is normally most active.  At the same time each day. A good time to count movements is while you are resting, after having something to eat and drink. How do I count fetal movements? 1. Find a quiet, comfortable area. Sit, or lie down on your side. 2. Write down the date, the start time and stop time, and the number of movements that you felt between those two times. Take this information with you to your health care visits. 3. Write down your start time when you feel the first movement. 4. Count kicks, flutters, swishes, rolls, and jabs. You should feel at least 10 movements. 5. You may stop counting after you have felt 10 movements, or if you have been counting for 2 hours. Write down the stop time. 6. If you do not feel 10 movements in 2 hours, contact your health care provider for further instructions. Your health care provider may want to do additional tests  to assess your baby's well-being. Contact a health care provider if:  You feel fewer than 10 movements in 2 hours.  Your baby is not moving like he or she usually does. Date: ____________ Start time: ____________ Stop time: ____________ Movements: ____________ Date: ____________ Start time: ____________ Stop time: ____________ Movements: ____________ Date: ____________ Start time: ____________ Stop time: ____________ Movements: ____________ Date: ____________ Start time: ____________ Stop time: ____________ Movements: ____________ Date: ____________ Start time: ____________ Stop time: ____________ Movements: ____________ Date: ____________ Start time: ____________ Stop time: ____________ Movements: ____________ Date: ____________ Start time: ____________ Stop time: ____________ Movements: ____________ Date: ____________ Start time: ____________ Stop time: ____________ Movements: ____________ Date: ____________ Start time: ____________ Stop time: ____________ Movements: ____________ This information is not intended to replace advice given to you by your health care provider. Make sure you discuss any questions you have with your health care provider. Document Revised: 02/14/2019 Document Reviewed: 02/14/2019 Elsevier Patient Education  2020 Elsevier Inc.  

## 2019-08-16 ENCOUNTER — Inpatient Hospital Stay (HOSPITAL_COMMUNITY)
Admission: AD | Admit: 2019-08-16 | Discharge: 2019-08-16 | Disposition: A | Discharge status: Home / Self Care | Payer: BC Managed Care – PPO | Attending: Obstetrics & Gynecology | Admitting: Obstetrics & Gynecology

## 2019-08-16 ENCOUNTER — Inpatient Hospital Stay (HOSPITAL_BASED_OUTPATIENT_CLINIC_OR_DEPARTMENT_OTHER): Payer: BC Managed Care – PPO

## 2019-08-16 ENCOUNTER — Other Ambulatory Visit: Payer: Self-pay

## 2019-08-16 ENCOUNTER — Encounter (HOSPITAL_COMMUNITY): Payer: Self-pay | Admitting: Obstetrics & Gynecology

## 2019-08-16 DIAGNOSIS — O42913 Preterm premature rupture of membranes, unspecified as to length of time between rupture and onset of labor, third trimester: Secondary | ICD-10-CM | POA: Diagnosis not present

## 2019-08-16 DIAGNOSIS — O4193X0 Disorder of amniotic fluid and membranes, unspecified, third trimester, not applicable or unspecified: Secondary | ICD-10-CM

## 2019-08-16 DIAGNOSIS — Z3A35 35 weeks gestation of pregnancy: Secondary | ICD-10-CM

## 2019-08-16 DIAGNOSIS — N898 Other specified noninflammatory disorders of vagina: Secondary | ICD-10-CM | POA: Diagnosis present

## 2019-08-16 DIAGNOSIS — Z3689 Encounter for other specified antenatal screening: Secondary | ICD-10-CM

## 2019-08-16 DIAGNOSIS — O4190X0 Disorder of amniotic fluid and membranes, unspecified, unspecified trimester, not applicable or unspecified: Secondary | ICD-10-CM

## 2019-08-16 DIAGNOSIS — Z0371 Encounter for suspected problem with amniotic cavity and membrane ruled out: Secondary | ICD-10-CM | POA: Diagnosis not present

## 2019-08-16 LAB — AMNISURE RUPTURE OF MEMBRANE (ROM) NOT AT ARMC: Amnisure ROM: NEGATIVE

## 2019-08-16 LAB — POCT FERN TEST: POCT Fern Test: NEGATIVE

## 2019-08-16 NOTE — MAU Provider Note (Signed)
First Provider Initiated Contact with Patient 08/16/19 1352      S: [name] is a 24 y.o. G2P1001 at [redacted]w[redacted]d  who presents to MAU today complaining of leaking of fluid since early this morning when she woke up in a pool of clear, odorless fluid in bed. She denies vaginal bleeding. She denies contractions, but reports pelvic pressure and reports LBP. She reports normal fetal movement.    O: BP 110/62 (BP Location: Right Arm)   Pulse 89   Temp 98.3 F (36.8 C) (Oral)   Resp 19   Ht 5' (1.524 m)   Wt 59.8 kg   LMP 12/08/2018 (Exact Date)   SpO2 100%   BMI 25.74 kg/m  GENERAL: Well-developed, well-nourished female in no acute distress.  HEAD: Normocephalic, atraumatic.  CHEST: Normal effort of breathing, regular heart rate ABDOMEN: Soft, nontender, gravid PELVIC: Normal external female genitalia. Vagina is pink and rugated. Cervix with normal contour, no lesions. Normal discharge.  No pooling, no leaking of fluid from os.  Cervical exam: midline/50/3 Dilation: 3 Effacement (%): 50 Presentation: Vertex Exam by:: N. Nugent, NP  Fetal Monitoring: reactive Baseline: 145/150 Variability: moderate Accelerations: present, 15x15 Decelerations: ?early Contractions: few, irregular  Results for orders placed or performed during the hospital encounter of 08/16/19 (from the past 24 hour(s))  POCT fern test     Status: None   Collection Time: 08/16/19  2:13 PM  Result Value Ref Range   POCT Fern Test Negative = intact amniotic membranes   Amnisure rupture of membrane (rom)not at [facility]     Status: None   Collection Time: 08/16/19  2:30 PM  Result Value Ref Range   Amnisure ROM NEGATIVE    [place]: AFI 11.14cm, VTX CE: unchanged at end of MAU visit  A: SIUP at [redacted]w[redacted]d  Membranes intact  P: -return MAU precautions given, pt discharged to home in stable condition  Nugent, Odie Sera, NP 08/16/2019 4:32 PM

## 2019-08-16 NOTE — Discharge Instructions (Signed)

## 2019-08-16 NOTE — MAU Note (Signed)
Last night during the night, woke up feeling cold, felt drenched. Has continued to leak.  Finally put a pad on.  Lower back started hurting more, coming in waves.  Was 2 cm when last checked

## 2019-08-21 ENCOUNTER — Other Ambulatory Visit: Payer: Self-pay

## 2019-08-21 ENCOUNTER — Encounter: Payer: Self-pay | Admitting: Family Medicine

## 2019-08-21 ENCOUNTER — Ambulatory Visit (INDEPENDENT_AMBULATORY_CARE_PROVIDER_SITE_OTHER): Payer: BC Managed Care – PPO | Admitting: Family Medicine

## 2019-08-21 VITALS — BP 114/75 | HR 96 | Wt 133.0 lb

## 2019-08-21 DIAGNOSIS — Z3493 Encounter for supervision of normal pregnancy, unspecified, third trimester: Secondary | ICD-10-CM

## 2019-08-21 DIAGNOSIS — O99891 Other specified diseases and conditions complicating pregnancy: Secondary | ICD-10-CM

## 2019-08-21 DIAGNOSIS — O34219 Maternal care for unspecified type scar from previous cesarean delivery: Secondary | ICD-10-CM

## 2019-08-21 DIAGNOSIS — Z3A36 36 weeks gestation of pregnancy: Secondary | ICD-10-CM

## 2019-08-21 DIAGNOSIS — R8271 Bacteriuria: Secondary | ICD-10-CM

## 2019-08-21 NOTE — Progress Notes (Signed)
   PRENATAL VISIT NOTE  Subjective:  Alicia Rich is a 24 y.o. G2P1001 at [redacted]w[redacted]d being seen today for ongoing prenatal care.  She is currently monitored for the following issues for this low-risk pregnancy and has Previous cesarean section complicating pregnancy, antepartum condition or complication; Encounter for supervision of low-risk pregnancy in first trimester; and Asymptomatic bacteriuria during pregnancy on their problem list.  Patient reports no complaints.  Contractions: Irregular. Vag. Bleeding: None.  Movement: Present. Denies leaking of fluid.   3 CM at hospital on 1/30  The following portions of the patient's history were reviewed and updated as appropriate: allergies, current medications, past family history, past medical history, past social history, past surgical history and problem list.   Objective:   Vitals:   08/21/19 1133  BP: 114/75  Pulse: 96  Weight: 133 lb (60.3 kg)    Fetal Status: Fetal Heart Rate (bpm): 160 Fundal Height: 36 cm Movement: Present  Presentation: Vertex  General:  Alert, oriented and cooperative. Patient is in no acute distress.  Skin: Skin is warm and dry. No rash noted.   Cardiovascular: Normal heart rate noted  Respiratory: Normal respiratory effort, no problems with respiration noted  Abdomen: Soft, gravid, appropriate for gestational age.  Pain/Pressure: Absent     Pelvic: Cervical exam performed Dilation: 3 Effacement (%): 70 Station: -3  Extremities: Normal range of motion.  Edema: None  Mental Status: Normal mood and affect. Normal behavior. Normal judgment and thought content.   Assessment and Plan:  Pregnancy: G2P1001 at 108w4d  1. Asymptomatic bacteriuria during pregnancy TOC neg  2. Encounter for supervision of low-risk pregnancy in third trimester Up to date Has CS scheduled  GBS  Today Had evaluation for ROM- AFI was 11.4, Amnisure neg (08/16/2019) Brought FMLA paperwork for husband today  Reviewed s/sx of  labor  3. Previous cesarean section complicating pregnancy, antepartum condition or complication RCS planned Desires TOLAC if in spontaneous labor   Preterm labor symptoms and general obstetric precautions including but not limited to vaginal bleeding, contractions, leaking of fluid and fetal movement were reviewed in detail with the patient. Please refer to After Visit Summary for other counseling recommendations.   Return in about 1 week (around 08/28/2019) for Routine prenatal care.  Future Appointments  Date Time Provider Department Center  08/28/2019  3:15 PM Calvert Cantor, CNM [facility]    Federico Flake, MD

## 2019-08-25 LAB — CULTURE, BETA STREP (GROUP B ONLY): Strep Gp B Culture: NEGATIVE

## 2019-08-27 ENCOUNTER — Encounter (HOSPITAL_COMMUNITY): Payer: Self-pay

## 2019-08-27 NOTE — Patient Instructions (Addendum)
Darbi Chandran  08/27/2019   Your procedure is scheduled on:  09/08/2019  Arrive at 0730 at Entrance C on [address] at [facility]  and [employer]. You are invited to use the FREE valet parking or use the Visitor's parking deck.  Pick up the phone at the desk and dial [phone].  Call this number if you have problems the morning of surgery: [phone]  Remember:   Do not eat food:(After Midnight) Desps de medianoche.  Do not drink clear liquids: (After Midnight) Desps de medianoche.  Take these medicines the morning of surgery with A SIP OF WATER:  none   Do not wear jewelry, make-up or nail polish.  Do not wear lotions, powders, or perfumes. Do not wear deodorant.  Do not shave 48 hours prior to surgery.  Do not bring valuables to the hospital.  [facility] is not   responsible for any belongings or valuables brought to the hospital.  Contacts, dentures or bridgework may not be worn into surgery.  Leave suitcase in the car. After surgery it may be brought to your room.  For patients admitted to the hospital, checkout time is 11:00 AM the day of              discharge.      Please read over the following fact sheets that you were given:     Preparing for Surgery  .0

## 2019-08-28 ENCOUNTER — Inpatient Hospital Stay (HOSPITAL_COMMUNITY)
Admission: AD | Admit: 2019-08-28 | Discharge: 2019-08-28 | Disposition: A | Discharge status: Home / Self Care | Payer: BC Managed Care – PPO | Attending: Obstetrics & Gynecology | Admitting: Obstetrics & Gynecology

## 2019-08-28 ENCOUNTER — Ambulatory Visit (INDEPENDENT_AMBULATORY_CARE_PROVIDER_SITE_OTHER): Payer: BC Managed Care – PPO | Admitting: Advanced Practice Midwife

## 2019-08-28 ENCOUNTER — Other Ambulatory Visit: Payer: Self-pay

## 2019-08-28 ENCOUNTER — Encounter (HOSPITAL_COMMUNITY): Payer: Self-pay | Admitting: Obstetrics & Gynecology

## 2019-08-28 VITALS — BP 133/64 | HR 74 | Wt 136.0 lb

## 2019-08-28 DIAGNOSIS — Z3493 Encounter for supervision of normal pregnancy, unspecified, third trimester: Secondary | ICD-10-CM

## 2019-08-28 DIAGNOSIS — O471 False labor at or after 37 completed weeks of gestation: Secondary | ICD-10-CM | POA: Diagnosis present

## 2019-08-28 DIAGNOSIS — Z3A37 37 weeks gestation of pregnancy: Secondary | ICD-10-CM | POA: Insufficient documentation

## 2019-08-28 DIAGNOSIS — O34219 Maternal care for unspecified type scar from previous cesarean delivery: Secondary | ICD-10-CM

## 2019-08-28 MED ORDER — LACTATED RINGERS IV BOLUS
1000.0000 mL | Freq: Once | INTRAVENOUS | Status: AC
  Administered 2019-08-28: 20:00:00 1000 mL via INTRAVENOUS

## 2019-08-28 NOTE — Patient Instructions (Signed)

## 2019-08-28 NOTE — MAU Note (Signed)
Patient reports contractions that started yesterday-now every 5-10 mins.  Denies LOF/VB.  Feels like she lost her mucous plug.  Endorses + FM.

## 2019-08-28 NOTE — Discharge Instructions (Signed)
Braxton Hicks Contractions °Contractions of the uterus can occur throughout pregnancy, but they are not always a sign that you are in labor. You may have practice contractions called Braxton Hicks contractions. These false labor contractions are sometimes confused with true labor. °What are Braxton Hicks contractions? °Braxton Hicks contractions are tightening movements that occur in the muscles of the uterus before labor. Unlike true labor contractions, these contractions do not result in opening (dilation) and thinning of the cervix. Toward the end of pregnancy (32-34 weeks), Braxton Hicks contractions can happen more often and may become stronger. These contractions are sometimes difficult to tell apart from true labor because they can be very uncomfortable. You should not feel embarrassed if you go to the hospital with false labor. °Sometimes, the only way to tell if you are in true labor is for your health care provider to look for changes in the cervix. The health care provider will do a physical exam and may monitor your contractions. If you are not in true labor, the exam should show that your cervix is not dilating and your water has not broken. °If there are no other health problems associated with your pregnancy, it is completely safe for you to be sent home with false labor. You may continue to have Braxton Hicks contractions until you go into true labor. °How to tell the difference between true labor and false labor °True labor °· Contractions last 30-70 seconds. °· Contractions become very regular. °· Discomfort is usually felt in the top of the uterus, and it spreads to the lower abdomen and low back. °· Contractions do not go away with walking. °· Contractions usually become more intense and increase in frequency. °· The cervix dilates and gets thinner. °False labor °· Contractions are usually shorter and not as strong as true labor contractions. °· Contractions are usually irregular. °· Contractions  are often felt in the front of the lower abdomen and in the groin. °· Contractions may go away when you walk around or change positions while lying down. °· Contractions get weaker and are shorter-lasting as time goes on. °· The cervix usually does not dilate or become thin. °Follow these instructions at home: ° °· Take over-the-counter and prescription medicines only as told by your health care provider. °· Keep up with your usual exercises and follow other instructions from your health care provider. °· Eat and drink lightly if you think you are going into labor. °· If Braxton Hicks contractions are making you uncomfortable: °? Change your position from lying down or resting to walking, or change from walking to resting. °? Sit and rest in a tub of warm water. °? Drink enough fluid to keep your urine pale yellow. Dehydration may cause these contractions. °? Do slow and deep breathing several times an hour. °· Keep all follow-up prenatal visits as told by your health care provider. This is important. °Contact a health care provider if: °· You have a fever. °· You have continuous pain in your abdomen. °Get help right away if: °· Your contractions become stronger, more regular, and closer together. °· You have fluid leaking or gushing from your vagina. °· You pass blood-tinged mucus (bloody show). °· You have bleeding from your vagina. °· You have low back pain that you never had before. °· You feel your baby’s head pushing down and causing pelvic pressure. °· Your baby is not moving inside you as much as it used to. °Summary °· Contractions that occur before labor are   called Braxton Hicks contractions, false labor, or practice contractions. °· Braxton Hicks contractions are usually shorter, weaker, farther apart, and less regular than true labor contractions. True labor contractions usually become progressively stronger and regular, and they become more frequent. °· Manage discomfort from Braxton Hicks contractions  by changing position, resting in a warm bath, drinking plenty of water, or practicing deep breathing. °This information is not intended to replace advice given to you by your health care provider. Make sure you discuss any questions you have with your health care provider. °Document Revised: 06/09/2017 Document Reviewed: 11/10/2016 °Elsevier Patient Education © 2020 [employer]. ° °

## 2019-08-28 NOTE — MAU Provider Note (Signed)
None      S: [name] is a 24 y.o. G2P1001 at [redacted]w[redacted]d  who presents to MAU today complaining of irregular contractions q 5-10 minutes since yesterday. She denies vaginal bleeding. She denies LOF. She reports normal fetal movement.    O: BP 122/71 (BP Location: Right Arm)   Pulse (!) 107   Temp 98.6 F (37 C)   Resp 18   Wt 61.9 kg   LMP 12/08/2018 (Exact Date)   BMI 26.66 kg/m  GENERAL: Well-developed, well-nourished female in no acute distress.  HEAD: Normocephalic, atraumatic.  CHEST: Normal effort of breathing, regular heart rate ABDOMEN: Soft, nontender, gravid  Cervical exam:  Dilation: 3.5 Effacement (%): 70 Station: -2 Presentation: Vertex Exam by:: Corine Shelter   Fetal Monitoring: Baseline: 150 Variability: moderate Accelerations: 15x15 Decelerations: none Contractions: irregular   A: SIUP at [redacted]w[redacted]d  False labor  P: received 1 L IV bolus, educated on staying hydrated Follow up in office as scheduled. Return precautions given  Leatta Alewine L, DO 08/28/2019 9:05 PM

## 2019-08-28 NOTE — Progress Notes (Signed)
   PRENATAL VISIT NOTE  Subjective:  Alicia Rich is a 24 y.o. G2P1001 at [redacted]w[redacted]d being seen today for ongoing prenatal care.  She is currently monitored for the following issues for this low-risk pregnancy and has Previous cesarean section complicating pregnancy, antepartum condition or complication; Encounter for supervision of low-risk pregnancy in first trimester; and Asymptomatic bacteriuria during pregnancy on their problem list.  Patient reports recurrent uncomfortable contractions. Movement: Present. Denies leaking of fluid or vaginal bleeding.  The following portions of the patient's history were reviewed and updated as appropriate: allergies, current medications, past family history, past medical history, past social history, past surgical history and problem list. Problem list updated.  Objective:   Vitals:   08/28/19 1507  BP: 133/64  Pulse: 74  Weight: 136 lb (61.7 kg)    Fetal Status: Fetal Heart Rate (bpm): 155 Fundal Height: 37 cm Movement: Present  Presentation: Vertex  General:  Alert, oriented and cooperative. Patient is in no acute distress.  Skin: Skin is warm and dry. No rash noted.   Cardiovascular: Normal heart rate noted  Respiratory: Normal respiratory effort, no problems with respiration noted  Abdomen: Soft, gravid, appropriate for gestational age.  Pain/Pressure: Absent     Pelvic: Cervical exam performed Dilation: 3 Effacement (%): 70 Station: -3  Extremities: Normal range of motion.  Edema: None  Mental Status: Normal mood and affect. Normal behavior. Normal judgment and thought content.   Assessment and Plan:  Pregnancy: G2P1001 at [redacted]w[redacted]d  1. Encounter for supervision of low-risk pregnancy in third trimester - RTC in one week - Cervix unchanged from previous  2. Previous cesarean section complicating pregnancy, antepartum condition or complication - Repeat cesarean scheduled for 09/08/2019 - Patient previously completed TOLAC form and checked  box marked for repeat cesarean. Confirmed today she does NOT desire TOLAC.   Term labor symptoms and general obstetric precautions including but not limited to vaginal bleeding, contractions, leaking of fluid and fetal movement were reviewed in detail with the patient. Please refer to After Visit Summary for other counseling recommendations.  Return in about 1 week (around 09/04/2019).  Future Appointments  Date Time Provider Department Center  09/02/2019  1:00 PM Federico Flake, MD [facility]  09/06/2019  8:30 AM [facility] 1 [facility] None    Calvert Cantor, [place]

## 2019-08-30 ENCOUNTER — Encounter: Payer: Self-pay | Admitting: *Deleted

## 2019-09-02 ENCOUNTER — Ambulatory Visit (INDEPENDENT_AMBULATORY_CARE_PROVIDER_SITE_OTHER): Payer: BC Managed Care – PPO | Admitting: Family Medicine

## 2019-09-02 ENCOUNTER — Telehealth (INDEPENDENT_AMBULATORY_CARE_PROVIDER_SITE_OTHER): Payer: BC Managed Care – PPO | Admitting: Family Medicine

## 2019-09-02 ENCOUNTER — Other Ambulatory Visit: Payer: Self-pay

## 2019-09-02 VITALS — BP 112/76 | HR 111 | Wt 137.0 lb

## 2019-09-02 DIAGNOSIS — O26893 Other specified pregnancy related conditions, third trimester: Secondary | ICD-10-CM

## 2019-09-02 DIAGNOSIS — N898 Other specified noninflammatory disorders of vagina: Secondary | ICD-10-CM

## 2019-09-02 DIAGNOSIS — Z3491 Encounter for supervision of normal pregnancy, unspecified, first trimester: Secondary | ICD-10-CM

## 2019-09-02 DIAGNOSIS — Z3A38 38 weeks gestation of pregnancy: Secondary | ICD-10-CM

## 2019-09-02 DIAGNOSIS — O34219 Maternal care for unspecified type scar from previous cesarean delivery: Secondary | ICD-10-CM

## 2019-09-02 NOTE — Progress Notes (Signed)
Watery discharge for 3 days   I connected with  Su Grand on 09/02/19 at  1:00 PM EST by telephone and verified that I am speaking with the correct person using two identifiers.   I discussed the limitations, risks, security and privacy concerns of performing an evaluation and management service by telephone and the availability of in person appointments. I also discussed with the patient that there may be a patient responsible charge related to this service. The patient expressed understanding and agreed to proceed.  Scheryl Marten, RN 09/02/2019  1:03 PM

## 2019-09-02 NOTE — Progress Notes (Signed)
   PRENATAL VISIT NOTE  Subjective:  Alicia Rich is a 24 y.o. G2P1001 at [redacted]w[redacted]d being seen today for ongoing prenatal care.  She is currently monitored for the following issues for this low-risk pregnancy and has Previous cesarean section complicating pregnancy, antepartum condition or complication; Encounter for supervision of low-risk pregnancy in first trimester; and Asymptomatic bacteriuria during pregnancy on their problem list.  See Mychart visit note-- complaining of leaking fluid since Saturday with increased contraction intensity.    The following portions of the patient's history were reviewed and updated as appropriate: allergies, current medications, past family history, past medical history, past social history, past surgical history and problem list.   Objective:   Vitals:   09/02/19 1403  BP: 112/76  Pulse: (!) 111  Weight: 137 lb (62.1 kg)    Fetal Status: Fetal Heart Rate (bpm): 160      Presentation: Vertex  General:  Alert, oriented and cooperative. Patient is in no acute distress.  Skin: Skin is warm and dry. No rash noted.   Cardiovascular: Normal heart rate noted  Respiratory: Normal respiratory effort, no problems with respiration noted  Abdomen: Soft, gravid, appropriate for gestational age.        Pelvic: Cervical exam performed Dilation: 3 Effacement (%): 70 Station: -3  Extremities: Normal range of motion.     Mental Status: Normal mood and affect. Normal behavior. Normal judgment and thought content.    Performed office [place] for AFI to help r/o rupture MVP 4.8 cm AFI= 12.4 cm Vertex infant.    Assessment and Plan:  Pregnancy: G2P1001 at [redacted]w[redacted]d  #R/O ROM/Vaginal Discharge in pregnancy - Nitrazine negative, pooling positive, fern negative and AFI 12.4 (WNL) - Effectively rulled out rupture of membranes - Counseled to go to the hospital for continued fluid loss or increased contractions.   . Term labor symptoms and general obstetric precautions  including but not limited to vaginal bleeding, contractions, leaking of fluid and fetal movement were reviewed in detail with the patient. Please refer to After Visit Summary for other counseling recommendations.   Return in about 1 week (around 09/09/2019) for Scheduled prenatal/NST.  Future Appointments  Date Time Provider Department Center  09/06/2019  8:30 AM [facility] 1 [facility] None  09/10/2019  1:15 PM [place] Bing, MD [facility]    Federico Flake, MD

## 2019-09-02 NOTE — Progress Notes (Signed)
I connected with@ on 09/02/19 at  1:00 PM EST by: MyChart and verified that I am speaking with the correct person using two identifiers.  Patient is located at [facility] and provider is located at [facility].     The purpose of this virtual visit is to provide medical care while limiting exposure to the novel coronavirus. I discussed the limitations, risks, security and privacy concerns of performing an evaluation and management service by MyChart and the availability of in person appointments. I also discussed with the patient that there may be a patient responsible charge related to this service. By engaging in this virtual visit, you consent to the provision of healthcare.  Additionally, you authorize for your insurance to be billed for the services provided during this visit.  The patient expressed understanding and agreed to proceed.  The following staff members participated in the virtual visit:  Scheryl Marten RN    PRENATAL VISIT NOTE  Subjective:  Alicia Rich is a 24 y.o. G2P1001 at [redacted]w[redacted]d  for phone visit for ongoing prenatal care.  She is currently monitored for the following issues for this low-risk pregnancy and has Previous cesarean section complicating pregnancy, antepartum condition or complication; Encounter for supervision of low-risk pregnancy in first trimester; and Asymptomatic bacteriuria during pregnancy on their problem list.  Patient reports watery discharge since Saturday- in the AM, reports a long"yellow" discharge. The discharge is thick and now watery. She is currently wearing a diaper. Feels like she is always "peeing" No smell to the fluid. Continued contractions and they are increasing in intensity-- has woke up at night from painful contraction.  Contractions: Irregular. Vag. Bleeding: None.  Movement: Present. Denies leaking of fluid.   The following portions of the patient's history were reviewed and updated as appropriate: allergies, current medications, past family  history, past medical history, past social history, past surgical history and problem list.   Objective:  There were no vitals filed for this visit. Self-Obtained  Fetal Status:     Movement: Present     Assessment and Plan:  Pregnancy: G2P1001 at [redacted]w[redacted]d  1. Previous cesarean section complicating pregnancy, antepartum condition or complication Desires TOLAC if spontaneous labor  2. Encounter for supervision of low-risk pregnancy in first trimester Concern for ROM discussed with patient Recommended MAU evaluation and if unable to do this, recommended clinic appt to do assessment for ROM given history she presented.  Patient asked to talk to husband and will call back clinic whether she can go to the hospital   Term labor symptoms and general obstetric precautions including but not limited to vaginal bleeding, contractions, leaking of fluid and fetal movement were reviewed in detail with the patient.  No follow-ups on file.  Future Appointments  Date Time Provider Department Center  09/06/2019  8:30 AM [facility] 1 [facility] None  09/10/2019  1:15 PM [address] Bing, MD [facility]     Time spent on virtual visit: 10 minutes  Federico Flake, MD

## 2019-09-06 ENCOUNTER — Other Ambulatory Visit (HOSPITAL_COMMUNITY)
Admission: RE | Admit: 2019-09-06 | Discharge: 2019-09-06 | Disposition: A | Discharge status: Home / Self Care | Payer: BC Managed Care – PPO | Source: Ambulatory Visit | Attending: Obstetrics & Gynecology | Admitting: Obstetrics & Gynecology

## 2019-09-06 ENCOUNTER — Other Ambulatory Visit: Payer: Self-pay

## 2019-09-06 DIAGNOSIS — Z20822 Contact with and (suspected) exposure to covid-19: Secondary | ICD-10-CM | POA: Diagnosis present

## 2019-09-06 LAB — CBC
HCT: 36.3 % (ref 36.0–46.0)
Hemoglobin: 11.6 g/dL — ABNORMAL LOW (ref 12.0–15.0)
MCH: 28.9 pg (ref 26.0–34.0)
MCHC: 32 g/dL (ref 30.0–36.0)
MCV: 90.3 fL (ref 80.0–100.0)
Platelets: 364 10*3/uL (ref 150–400)
RBC: 4.02 MIL/uL (ref 3.87–5.11)
RDW: 12.7 % (ref 11.5–15.5)
WBC: 10.1 10*3/uL (ref 4.0–10.5)
nRBC: 0 % (ref 0.0–0.2)

## 2019-09-06 LAB — TYPE AND SCREEN
ABO/RH(D): O POS
Antibody Screen: NEGATIVE

## 2019-09-06 LAB — COMPREHENSIVE METABOLIC PANEL
ALT: 10 U/L (ref 0–44)
AST: 16 U/L (ref 15–41)
Albumin: 2.9 g/dL — ABNORMAL LOW (ref 3.5–5.0)
Alkaline Phosphatase: 235 U/L — ABNORMAL HIGH (ref 38–126)
Anion gap: 9 (ref 5–15)
BUN: 7 mg/dL (ref 6–20)
CO2: 23 mmol/L (ref 22–32)
Calcium: 8.9 mg/dL (ref 8.9–10.3)
Chloride: 103 mmol/L (ref 98–111)
Creatinine, Ser: 0.58 mg/dL (ref 0.44–1.00)
GFR calc Af Amer: >60 mL/min (ref >60)
GFR calc non Af Amer: >60 mL/min (ref >60)
Glucose, Bld: 77 mg/dL (ref 70–99)
Potassium: 4.2 mmol/L (ref 3.5–5.1)
Sodium: 135 mmol/L (ref 135–145)
Total Bilirubin: 0.5 mg/dL (ref 0.3–1.2)
Total Protein: 6.5 g/dL (ref 6.5–8.1)

## 2019-09-06 LAB — RPR: RPR Ser Ql: NONREACTIVE

## 2019-09-06 LAB — SARS CORONAVIRUS 2 (TAT 6-24 HRS): SARS Coronavirus 2: NEGATIVE

## 2019-09-06 NOTE — MAU Note (Signed)
Asymptomatic, swab collected. Lab called 

## 2019-09-08 ENCOUNTER — Other Ambulatory Visit: Payer: Self-pay

## 2019-09-08 ENCOUNTER — Encounter (HOSPITAL_COMMUNITY)
Admission: AD | Disposition: A | Discharge status: Home / Self Care | Payer: Self-pay | Source: Home / Self Care | Attending: Obstetrics & Gynecology

## 2019-09-08 ENCOUNTER — Inpatient Hospital Stay (HOSPITAL_COMMUNITY)
Admission: RE | Admit: 2019-09-08 | Payer: BC Managed Care – PPO | Source: Home / Self Care | Admitting: Obstetrics & Gynecology

## 2019-09-08 ENCOUNTER — Inpatient Hospital Stay (HOSPITAL_COMMUNITY)
Admission: AD | Admit: 2019-09-08 | Discharge: 2019-09-10 | DRG: 807 | Disposition: A | Discharge status: Home / Self Care | Payer: BC Managed Care – PPO | Attending: Obstetrics & Gynecology | Admitting: Obstetrics & Gynecology

## 2019-09-08 ENCOUNTER — Encounter (HOSPITAL_COMMUNITY): Payer: Self-pay | Admitting: Obstetrics & Gynecology

## 2019-09-08 DIAGNOSIS — Z3A39 39 weeks gestation of pregnancy: Secondary | ICD-10-CM

## 2019-09-08 DIAGNOSIS — Z98891 History of uterine scar from previous surgery: Secondary | ICD-10-CM | POA: Diagnosis present

## 2019-09-08 DIAGNOSIS — O34219 Maternal care for unspecified type scar from previous cesarean delivery: Principal | ICD-10-CM | POA: Diagnosis present

## 2019-09-08 DIAGNOSIS — O26893 Other specified pregnancy related conditions, third trimester: Secondary | ICD-10-CM | POA: Diagnosis present

## 2019-09-08 SURGERY — Surgical Case
Anesthesia: Regional

## 2019-09-08 MED ORDER — OXYCODONE HCL 5 MG PO TABS: 5.0000 mg | ORAL_TABLET | ORAL | Status: DC | PRN

## 2019-09-08 MED ORDER — COCONUT OIL OIL: 1.0000 "application " | TOPICAL_OIL | Status: DC | PRN

## 2019-09-08 MED ORDER — ONDANSETRON HCL 4 MG PO TABS: 4.0000 mg | ORAL_TABLET | ORAL | Status: DC | PRN

## 2019-09-08 MED ORDER — FENTANYL CITRATE (PF) 100 MCG/2ML IJ SOLN
100.0000 ug | INTRAMUSCULAR | Status: DC | PRN
  Administered 2019-09-08: 100 ug via INTRAVENOUS
  Filled 2019-09-08 (×2): qty 2

## 2019-09-08 MED ORDER — IBUPROFEN 600 MG PO TABS
600.0000 mg | ORAL_TABLET | Freq: Four times a day (QID) | ORAL | Status: DC
  Administered 2019-09-08 – 2019-09-10 (×8): 600 mg via ORAL
  Filled 2019-09-08 (×8): qty 1

## 2019-09-08 MED ORDER — ACETAMINOPHEN 325 MG PO TABS
650.0000 mg | ORAL_TABLET | ORAL | Status: DC | PRN
  Administered 2019-09-08: 650 mg via ORAL
  Filled 2019-09-08: qty 2

## 2019-09-08 MED ORDER — ACETAMINOPHEN 325 MG PO TABS
650.0000 mg | ORAL_TABLET | ORAL | Status: DC | PRN
  Administered 2019-09-08 – 2019-09-09 (×2): 650 mg via ORAL
  Filled 2019-09-08 (×2): qty 2

## 2019-09-08 MED ORDER — ONDANSETRON HCL 4 MG/2ML IJ SOLN: 4.0000 mg | INTRAMUSCULAR | Status: DC | PRN

## 2019-09-08 MED ORDER — OXYTOCIN 40 UNITS IN NORMAL SALINE INFUSION - SIMPLE MED
INTRAVENOUS | Status: AC
  Filled 2019-09-08: qty 1000

## 2019-09-08 MED ORDER — SENNOSIDES-DOCUSATE SODIUM 8.6-50 MG PO TABS
2.0000 | ORAL_TABLET | ORAL | Status: DC
  Administered 2019-09-08 – 2019-09-09 (×2): 2 via ORAL
  Filled 2019-09-08 (×2): qty 2

## 2019-09-08 MED ORDER — SOD CITRATE-CITRIC ACID 500-334 MG/5ML PO SOLN: 30.0000 mL | ORAL | Status: DC | PRN

## 2019-09-08 MED ORDER — BENZOCAINE-MENTHOL 20-0.5 % EX AERO
1.0000 "application " | INHALATION_SPRAY | CUTANEOUS | Status: DC | PRN
  Administered 2019-09-08: 1 via TOPICAL
  Filled 2019-09-08: qty 56

## 2019-09-08 MED ORDER — OXYTOCIN BOLUS FROM INFUSION
500.0000 mL | Freq: Once | INTRAVENOUS | Status: AC
  Administered 2019-09-08: 08:00:00 500 mL via INTRAVENOUS

## 2019-09-08 MED ORDER — OXYCODONE HCL 5 MG PO TABS: 10.0000 mg | ORAL_TABLET | ORAL | Status: DC | PRN

## 2019-09-08 MED ORDER — DIPHENHYDRAMINE HCL 25 MG PO CAPS: 25.0000 mg | ORAL_CAPSULE | Freq: Four times a day (QID) | ORAL | Status: DC | PRN

## 2019-09-08 MED ORDER — SIMETHICONE 80 MG PO CHEW: 80.0000 mg | CHEWABLE_TABLET | ORAL | Status: DC | PRN

## 2019-09-08 MED ORDER — PRENATAL MULTIVITAMIN CH
1.0000 | ORAL_TABLET | Freq: Every day | ORAL | Status: DC
  Administered 2019-09-08 – 2019-09-09 (×2): 1 via ORAL
  Filled 2019-09-08 (×2): qty 1

## 2019-09-08 MED ORDER — OXYTOCIN 40 UNITS IN NORMAL SALINE INFUSION - SIMPLE MED
2.5000 [IU]/h | INTRAVENOUS | Status: DC
  Administered 2019-09-08: 09:00:00 2.5 [IU]/h via INTRAVENOUS

## 2019-09-08 MED ORDER — METHYLERGONOVINE MALEATE 0.2 MG/ML IJ SOLN
0.2000 mg | Freq: Once | INTRAMUSCULAR | Status: AC
  Administered 2019-09-08: 08:00:00 0.2 mg via INTRAMUSCULAR

## 2019-09-08 MED ORDER — LACTATED RINGERS IV SOLN: 500.0000 mL | INTRAVENOUS | Status: DC | PRN

## 2019-09-08 MED ORDER — DIBUCAINE (PERIANAL) 1 % EX OINT: 1.0000 "application " | TOPICAL_OINTMENT | CUTANEOUS | Status: DC | PRN

## 2019-09-08 MED ORDER — ONDANSETRON HCL 4 MG/2ML IJ SOLN: 4.0000 mg | Freq: Four times a day (QID) | INTRAMUSCULAR | Status: DC | PRN

## 2019-09-08 MED ORDER — LACTATED RINGERS IV SOLN: INTRAVENOUS | Status: DC

## 2019-09-08 MED ORDER — LIDOCAINE HCL (PF) 1 % IJ SOLN
30.0000 mL | INTRAMUSCULAR | Status: AC | PRN
  Administered 2019-09-08: 08:00:00 30 mL via SUBCUTANEOUS
  Filled 2019-09-08: qty 30

## 2019-09-08 MED ORDER — WITCH HAZEL-GLYCERIN EX PADS: 1.0000 "application " | MEDICATED_PAD | CUTANEOUS | Status: DC | PRN

## 2019-09-08 MED ORDER — MAGNESIUM HYDROXIDE 400 MG/5ML PO SUSP: 30.0000 mL | ORAL | Status: DC | PRN

## 2019-09-08 MED ORDER — METHYLERGONOVINE MALEATE 0.2 MG/ML IJ SOLN
INTRAMUSCULAR | Status: AC
  Filled 2019-09-08: qty 1

## 2019-09-08 NOTE — H&P (Signed)
OBSTETRIC ADMISSION HISTORY AND PHYSICAL  Alicia Rich is a 24 y.o. female G2P1001 with IUP at [redacted]w[redacted]d by LMP and 10 wk [place] presenting for SOL. Ctx started around 0200. She reports +FMs, No LOF, no VB, no blurry vision, headaches or peripheral edema, and RUQ pain.  She plans on breast feeding. She requests IUD outpatient for birth control. She received her prenatal care at [facility]   Dating: By LMP and 10 wk [place] --->  Estimated Date of Delivery: 09/14/19  Sono: 2/5   @[redacted]w[redacted]d , CWD, normal anatomy, cephalic presentation, posterior placental At 21wk : 468g, 52% EFW  Prenatal History/Complications: Previous C-section for breech presentation Preterm labor symptoms   Past Medical History: Past Medical History:  Diagnosis Date  . Medical history non-contributory     Past Surgical History: Past Surgical History:  Procedure Laterality Date  . CESAREAN SECTION N/A 04/14/2018   Procedure: CESAREAN SECTION;  Surgeon: 06/14/2018, MD;  Location: [facility] BIRTHING SUITES;  Service: Obstetrics;  Laterality: N/A;  . NO PAST SURGERIES      Obstetrical History: OB History    Gravida  2   Para  1   Term  1   Preterm  0   AB  0   Living  1     SAB  0   TAB  0   Ectopic  0   Multiple  0   Live Births  1           Social History: Social History   Socioeconomic History  . Marital status: Married    Spouse name: Not on file  . Number of children: Not on file  . Years of education: Not on file  . Highest education level: Not on file  Occupational History  . Not on file  Tobacco Use  . Smoking status: Never Smoker  . Smokeless tobacco: Never Used  Substance and Sexual Activity  . Alcohol use: No  . Drug use: No  . Sexual activity: Yes  Other Topics Concern  . Not on file  Social History Narrative   Married.   No children.   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   She aspires to take classes for nursing or cosmetology.    Social Determinants of Health   Financial Resource  Strain:   . Difficulty of Paying Living Expenses: Not on file  Food Insecurity:   . Worried About [employer] in the Last Year: Not on file  . Ran Out of Food in the Last Year: Not on file  Transportation Needs:   . Lack of Transportation (Medical): Not on file  . Lack of Transportation (Non-Medical): Not on file  Physical Activity:   . Days of Exercise per Week: Not on file  . Minutes of Exercise per Session: Not on file  Stress:   . Feeling of Stress : Not on file  Social Connections:   . Frequency of Communication with Friends and Family: Not on file  . Frequency of Social Gatherings with Friends and Family: Not on file  . Attends Religious Services: Not on file  . Active Member of Clubs or Organizations: Not on file  . Attends Programme researcher, broadcasting/film/video Meetings: Not on file  . Marital Status: Not on file    Family History: Family History  Problem Relation Age of Onset  . Heart attack Maternal Grandfather   . Heart disease Maternal Grandfather   . Healthy Mother   . Heart disease Paternal Grandfather   .  Stroke Paternal Grandfather     Allergies: No Known Allergies  Medications Prior to Admission  Medication Sig Dispense Refill Last Dose  . Prenatal Vit-Fe Fumarate-FA (PRENATAL MULTIVITAMIN) TABS tablet Take 1 tablet by mouth daily.    09/07/2019 at Unknown time  . Blood Pressure Monitoring (BLOOD PRESSURE CUFF) MISC 1 Device by Does not apply route once a week. To be monitored weekly from home 1 each 0      Review of Systems   All systems reviewed and negative except as stated in HPI  Blood pressure 123/72, pulse 91, temperature 97.9 F (36.6 C), temperature source Oral, resp. rate 20, last menstrual period 12/08/2018, unknown if currently breastfeeding. General appearance: alert, cooperative and appears stated age Lungs: normal effort Heart: regular rate  Abdomen: soft, non-tender; bowel sounds normal Pelvic: gravid uterus Extremities: Homans sign is  negative, no sign of DVT Presentation: cephalic by exam  Fetal monitoringBaseline: 145 bpm, Variability: Good {> 6 bpm), Accelerations: Reactive and Decelerations: Absent Uterine activity: Frequency: Every 3-4 minutes Dilation: 8 Effacement (%): 90   Prenatal labs: ABO, Rh: --/--/O POS (02/26 7035) Antibody: NEG (02/26 0852) Rubella: 1.77 (08/12 1101) RPR: NON REACTIVE (02/26 0852)  HBsAg: Negative (08/12 1101)  HIV: Non Reactive (12/16 0850)  GBS: Negative/-- (02/10 1140)  2 hr Glucola WNL Genetic screening  Low risk Anatomy US WNL  Prenatal Transfer Tool  Maternal Diabetes: No Genetic Screening: Normal Maternal Ultrasounds/Referrals: Normal Fetal Ultrasounds or other Referrals:  None Maternal Substance Abuse:  No Significant Maternal Medications:  None Significant Maternal Lab Results: Group B Strep negative  No results found for this or any previous visit (from the past 24 hour(s)).  Patient Active Problem List   Diagnosis Date Noted  . Asymptomatic bacteriuria during pregnancy 02/25/2019  . Encounter for supervision of low-risk pregnancy in first trimester 02/20/2019  . Previous cesarean section complicating pregnancy, antepartum condition or complication 00/93/8182    Assessment/Plan:  Alicia Rich is a 24 y.o. G2P1001 at [redacted]w[redacted]d here for for SOL.  #Labor:Vertex by exam. Active labor. Desired TOLAC if laboring prior to c-section date. VBAC consent signed. Anticipate VBAC. #Pain: Per patient request #FWB: Cat I; EFW: 3300g #ID:  GBS neg #MOF: Breast #MOC: IUD outpatient #Circ:  Outpatient   Barrington Ellison, MD [facility] Family Medicine Fellow, [facility] for [facility], [facility] 09/08/2019, 6:28 AM

## 2019-09-08 NOTE — Lactation Note (Signed)
This note was copied from a baby's chart. Lactation Consultation Note  Patient Name: Alicia Rich [id number]'H Date: 09/08/2019 Reason for consult: Initial assessment   P2 mom.  Exclusively pumped for 2 months with first child (now 1).   Has personal Medela DEBP at home.  LC offered assistance with hand exp and latch.  Mom was able to hand exp; drops seen.  Infant too sleepy to latch.  Mom and dad state infant fed well  Earlier.    LC reviewed BF basics, 8-12 feeds in 24hours, feeding cues, STS, and provided Lactation brochure and information about support groups and out patient services as well as phone number for voicemail.     Maternal Data Has patient been taught Hand Expression?: Yes Does the patient have breastfeeding experience prior to this delivery?: Yes  Feeding Feeding Type: Breast Fed  LATCH Score Latch: Too sleepy or reluctant, no latch achieved, no sucking elicited.  Audible Swallowing: None  Type of Nipple: Everted at rest and after stimulation  Comfort (Breast/Nipple): Soft / non-tender  Hold (Positioning): Assistance needed to correctly position infant at breast and maintain latch.  LATCH Score: 5  Interventions Interventions: Breast feeding basics reviewed;Assisted with latch;Skin to skin;Hand express;Position options  Lactation Tools Discussed/Used Tools: Pump(baby sleepy) Breast pump type: Manual Pump Review: Setup, frequency, and cleaning Initiated by:: karenjkane rn Date initiated:: 09/08/19(at 1335)   Consult Status Consult Status: Follow-up Date: 09/09/19 Follow-up type: In-patient    [name] 09/08/2019, 4:52 PM

## 2019-09-08 NOTE — MAU Note (Signed)
Pt reports to MAU c/o ctx every 5 min with bloody show. +FM. No LOF.

## 2019-09-08 NOTE — Discharge Summary (Signed)
Postpartum Discharge Summary     Patient Name: Alicia Rich DOB: 05-18-1996 MRN: [id number]  Date of admission: 09/08/2019 Delivering Provider: Chauncey Mann   Date of discharge: 09/10/2019  Admitting diagnosis: Labor and delivery, indication for care [O75.9] Intrauterine pregnancy: [redacted]w[redacted]d    Secondary diagnosis:  Active Problems:   Previous cesarean section complicating pregnancy, antepartum condition or complication   Labor and delivery indication for care or intervention   [redacted] weeks gestation of pregnancy  Additional problems: None     Discharge diagnosis: Term Pregnancy Delivered and VBAC                                                                                                Post partum procedures:None  Augmentation: AROM  Complications: None  Hospital course:  Onset of Labor With Vaginal Delivery     24y.o. yo G2P1001 at 330w1das admitted in Active Labor on 09/08/2019. Patient had an uncomplicated labor course as follows: Agreeable to [employer] admission and consent signed. Initial SVE: 8/90/0. AROM performed and patient progressed to complete. She then progressed to complete.  Membrane Rupture Time/Date: 6:38 AM ,09/08/2019   Intrapartum Procedures: Episiotomy: None [1]                                         Lacerations:  Labial [10]  Patient had a delivery of a Viable infant. 09/08/2019  Information for the patient's newborn:  SeShadie, Sweatman0[[id number]]     Pateint had an uncomplicated postpartum course. Desires IUD outpatient. She is ambulating, tolerating a regular diet, passing flatus, and urinating well. Patient is discharged home in stable condition on 09/10/19.  Delivery time: 7:56 AM    Magnesium Sulfate received: No BMZ received: previously on 2/1 and 2/2 for preterm labor symptoms Rhophylac:No MMR:No Transfusion:No  Physical exam  Vitals:   09/08/19 2355 09/09/19 0613 09/09/19 1428 09/09/19 2204  BP: 113/64 (!) 97/57 113/65  106/66  Pulse: 83 72 99 82  Resp: _0 Temp: 98.1 F (36.7 C) 98.2 F (36.8 C) 98.5 F (36.9 C) 98.3 F (36.8 C)  TempSrc: Oral Oral Oral Oral  SpO2:  100% 100%   Weight:      Height:       General: alert, cooperative and no distress Lochia: appropriate Uterine Fundus: firm Incision: N/A DVT Evaluation: No evidence of DVT seen on physical exam. Labs: Lab Results  Component Value Date   WBC 10.1 09/06/2019   HGB 11.6 (L) 09/06/2019   HCT 36.3 09/06/2019   MCV 90.3 09/06/2019   PLT 364 09/06/2019   CMP Latest Ref Rng & Units 09/06/2019  Glucose 70 - 99 mg/dL 77  BUN 6 - 20 mg/dL 7  Creatinine 0.44 - 1.00 mg/dL 0.58  Sodium 135 - 145 mmol/L 135  Potassium 3.5 - 5.1 mmol/L 4.2  Chloride 98 - 111 mmol/L 103  CO2 22 - 32 mmol/L 23  Calcium 8.9 - 10.3 mg/dL  8.9  Total Protein 6.5 - 8.1 g/dL 6.5  Total Bilirubin 0.3 - 1.2 mg/dL 0.5  Alkaline Phos 38 - 126 U/L 235(H)  AST 15 - 41 U/L 16  ALT 0 - 44 U/L 10   Edinburgh Score: Edinburgh Postnatal Depression Scale Screening Tool 09/08/2019  I have been able to laugh and see the funny side of things. 0  I have looked forward with enjoyment to things. 0  I have blamed myself unnecessarily when things went wrong. 0  I have been anxious or worried for no good reason. 0  I have felt scared or panicky for no good reason. 0  Things have been getting on top of me. 0  I have been so unhappy that I have had difficulty sleeping. 0  I have felt sad or miserable. 0  I have been so unhappy that I have been crying. 0  The thought of harming myself has occurred to me. 0  Edinburgh Postnatal Depression Scale Total 0    Discharge instruction: per After Visit Summary and "Baby and Me Booklet".  After visit meds:  Allergies as of 09/10/2019   No Known Allergies     Medication List    TAKE these medications   acetaminophen 325 MG tablet Commonly known as: Tylenol Take 2 tablets (650 mg total) by mouth every 6 (six) hours as  needed (for pain scale < 4). What changed:   medication strength  how much to take  reasons to take this   Blood Pressure Cuff Misc 1 Device by Does not apply route once a week. To be monitored weekly from home   cyclobenzaprine 10 MG tablet Commonly known as: FLEXERIL Take 1 tablet (10 mg total) by mouth 3 (three) times daily as needed for muscle spasms.   ibuprofen 600 MG tablet Commonly known as: ADVIL Take 1 tablet (600 mg total) by mouth every 6 (six) hours.   prenatal multivitamin Tabs tablet Take 1 tablet by mouth daily.       Diet: routine diet  Activity: Advance as tolerated. Pelvic rest for 6 weeks.   Outpatient follow up:4 weeks Follow up Appt: Future Appointments  Date Time Provider [place]  10/08/2019 10:30 AM Aletha Halim, MD [facility]   Follow up Visit:    Please schedule this patient for Postpartum visit in: 4 weeks with the following provider: Any provider In-Person Low risk pregnancy complicated by: prior c-section Delivery mode:  VBAC Anticipated Birth Control:  IUD outpatient PP Procedures needed: IUD placement  Schedule Integrated BH visit: no     Newborn Data: Live born female  Birth Weight: 3445g  APGAR: 75, 9  Newborn Delivery   Birth date/time: 09/08/2019 07:56:00 Delivery type: Vaginal, Spontaneous      Baby Feeding: Breast Disposition:home with mother   09/10/2019 Chauncey Mann, MD

## 2019-09-09 NOTE — Progress Notes (Signed)
Post Partum Day 1 Subjective: Patient reports feeling sore but overall pain is well-controlled. She is tolerating PO. Ambulating and urinating without difficulty. Lochia minimal.  Objective: Blood pressure 113/64, pulse 83, temperature 98.1 F (36.7 C), temperature source Oral, resp. rate 18, height 5' (1.524 m), weight 62.1 kg, last menstrual period 12/08/2018, SpO2 100 %, unknown if currently breastfeeding.  Physical Exam:  General: alert, cooperative and appears stated age Lochia: appropriate Uterine Fundus: firm Incision: NA DVT Evaluation: No evidence of DVT seen on physical exam.  Recent Labs    09/06/19 0852  HGB 11.6*  HCT 36.3    Assessment/Plan: Plan for discharge tomorrow. If infant can discharge today, please page team for discharge orders for mom. Breastfeeding Desires IUD outpatient Vitals stable    LOS: 1 day   Joselyn Arrow 09/09/2019, 4:05 AM

## 2019-09-09 NOTE — Lactation Note (Signed)
This note was copied from a baby's chart. Lactation Consultation Note  Patient Name: Alicia Rich Date: 09/09/2019   RN notified me that parents were thinking about giving formula instead of DBM. Mom had recently pumped again & not seen any colostrum. Parents were thinking about going ahead & providing formula since they would have to use it once they got home.   I discussed with parents how it's better to delay introduction of formula when possible. Mom has a history of ample supply when her milk comes to volume & she does not have a history of late onset of lactogenesis II.  I discussed how Mom could be surprised and her milk could begin coming to volume overnight. Since Mom got little to no sleep last night, I encouraged Mom to skip a pumping session or 2 & get some sleep.   Dad verbalized consent for continued use of DBM. I provided another bottle of DBM along with additional extra-slow flow nipples. Mom has a Medela DEBP at home.   [name] [facility] 09/09/2019, 2:34 PM

## 2019-09-09 NOTE — Lactation Note (Signed)
This note was copied from a baby's chart. Lactation Consultation Note  Patient Name: Alicia Rich [id number] Date: 09/09/2019   Infant is 24 hrs old with almost 8% weight loss in the 1st 24 hrs ("[name]" had 5 voids & 6 stools during the time period between birth and this morning's weight). Mom says that breastfeeding is going "pretty good," but parents note that infant falls asleep at the breast pretty easily. When infant is suckling, frequent swallows were noted (swallows verified by cervical auscultation), but infant was not content after feedings.   Mom signed a consent form for DBM. Parents chose bottle for supplementing. A yellow slow-flow nipple was used initially, but it was too fast for [employer]. The nipple was changed to an extra-slow flow nipple, which [name] did well with (some pacing still required,, which was shown to Dad).  Mom says her breast feel heavier today. With her 1st child, she was able to express 4-8 oz/session when her milk supply came to volume. Mom was set up with a DEBP. Size 24 flanges appear appropriate at this time.   [name] [facility] 09/09/2019, 8:33 AM

## 2019-09-09 NOTE — Lactation Note (Addendum)
This note was copied from a baby's chart. Lactation Consultation Note  Patient Name: Alicia Rich [id number] Date: 09/09/2019   Alicia Rich is sleeping content in bassinet. Parents report that this is the most infant has slept. Mom's goal for the next few hours is to pump & give EBM/DBM in a bottle. Mom wants to take a break from latching. Breast milk supplementation guidelines provided with an emphasis on feeding Alicia Rich more than the stated parameters if Alicia Rich cues for more.   About 30 min after the bottle feeding of DBM, infant spat up. Parents had thought that infant had spat up the entire previous bottle feeding (10 mL). From the linen & baby cap that Dad showed me, it appears that infant spat up around a medium amount. Note: infant had also fed from breast multiple times (with auscultated swallows) prior to supplementation (supplementing happened b/c he was not content after multiple feedings at the breast).   Parents report that their 1st infant had issues with spitting up after bottle feedings & eventually had to be put on Nutramigen. That child is now 90 months old & drinking whole milk without difficulty. I reassured parents and suggested that after burping, they hold Alicia Rich upright for about 10 minutes or so prior to placing in bassinet.   [name] [facility] 09/09/2019, 11:22 AM

## 2019-09-10 ENCOUNTER — Telehealth: Payer: BC Managed Care – PPO | Admitting: Obstetrics and Gynecology

## 2019-09-10 MED ORDER — IBUPROFEN 600 MG PO TABS: 600.0000 mg | ORAL_TABLET | Freq: Four times a day (QID) | ORAL | 0 refills | Status: DC

## 2019-09-10 MED ORDER — CYCLOBENZAPRINE HCL 10 MG PO TABS
10.0000 mg | ORAL_TABLET | Freq: Three times a day (TID) | ORAL | Status: DC | PRN
  Filled 2019-09-10: qty 1

## 2019-09-10 MED ORDER — ACETAMINOPHEN 325 MG PO TABS: 650.0000 mg | ORAL_TABLET | Freq: Four times a day (QID) | ORAL | 0 refills | Status: DC | PRN

## 2019-09-10 MED ORDER — CYCLOBENZAPRINE HCL 10 MG PO TABS: 10.0000 mg | ORAL_TABLET | Freq: Three times a day (TID) | ORAL | 0 refills | Status: DC | PRN

## 2019-09-10 NOTE — Lactation Note (Signed)
This note was copied from a baby's chart. Lactation Consultation Note:  Mother is a P2, infant is hours old and is now at 8 % wt loss.   Mother is offering ebm and donor milk . Mother was given comfort gels for slight discomfort.   Breastfeed infant with feeding cues Supplement infant with ebm/formula, according to supplemental guidelines. Pump using a DEBP after each feeding for 15-20 mins.   Reviewed S/S of Mastitis, discussed treatment and prevention of engorgement.   Mother to continue to cue base feed infant and feed at least 8-12 times or more in 24 hours and advised to allow for cluster feeding infant as needed.   Mother to continue to due STS. Mother is aware of available LC services at [facility], BFSG'S, OP Dept, and phone # for questions or concerns about breastfeeding.  Mother receptive to all teaching and plan of care.    Patient Name: Alicia Rich [id number] Date: 09/10/2019 Reason for consult: Follow-up assessment   Maternal Data    Feeding Feeding Type: Breast Milk with Formula added Nipple Type: Nfant Slow Flow (purple)  LATCH Score                   Interventions Interventions: Comfort gels  Lactation Tools Discussed/Used     Consult Status Consult Status: Complete    Michel Bickers 09/10/2019, 9:54 AM

## 2019-10-08 ENCOUNTER — Encounter: Payer: Self-pay | Admitting: Obstetrics and Gynecology

## 2019-10-08 ENCOUNTER — Ambulatory Visit (INDEPENDENT_AMBULATORY_CARE_PROVIDER_SITE_OTHER): Payer: BC Managed Care – PPO | Admitting: Obstetrics and Gynecology

## 2019-10-08 ENCOUNTER — Other Ambulatory Visit: Payer: Self-pay

## 2019-10-08 VITALS — BP 114/79 | HR 90 | Wt 127.0 lb

## 2019-10-08 DIAGNOSIS — Z98891 History of uterine scar from previous surgery: Secondary | ICD-10-CM

## 2019-10-08 MED ORDER — NORGESTIMATE-ETH ESTRADIOL 0.25-35 MG-MCG PO TABS: 1.0000 | ORAL_TABLET | Freq: Every day | ORAL | 3 refills | Status: DC

## 2019-10-08 NOTE — Progress Notes (Signed)
Obstetrics and Gynecology Postpartum Visit  Appointment Date: 10/08/2019  OBGYN Clinic: [facility]  Primary Care Provider: Patient, No Pcp Per  Chief Complaint:  Chief Complaint  Patient presents with  . Postpartum Care    History of Present Illness: Alicia Rich is a 24 y.o. Asian [id number] (No LMP recorded.), seen for the above chief complaint. Her past medical history is significant for h/o c-section   She is s/p 2/28 on VBAC/intact perineum (peri-clitoral, left and right labial repaired); she was discharged to home on PPD#2  Patient endorsing some pelvic soreness  Vaginal bleeding or discharge: spotting Breast or formula feeding: stopped breast feeding two weeks ago Intercourse: No  Contraception after delivery: abstinence PP depression s/s: No  Any bowel or bladder issues: No  Pap smear: no abnormalities (date: 2020)  Review of Systems: as noted in the History of Present Illness.  Medications Alicia Rich had no medications administered during this visit. Current Outpatient Medications  Medication Sig Dispense Refill  . Prenatal Vit-Fe Fumarate-FA (PRENATAL MULTIVITAMIN) TABS tablet Take 1 tablet by mouth daily.      No current facility-administered medications for this visit.    Allergies Patient has no known allergies.  Physical Exam:  BP 114/79   Pulse 90   Wt 127 lb (57.6 kg)   BMI 24.80 kg/m  Body mass index is 24.8 kg/m. General appearance: Well nourished, well developed female in no acute distress.  Respiratory:  Normal respiratory effort Abdomen: soft, nttp Neuro/Psych:  Normal mood and affect.  Skin:  Warm and dry.  Lymphatic:  No inguinal lymphadenopathy.   Pelvic exam: is not limited by body habitus EGBUS: within normal limits. Two subcm suture strands near the right side of the clitoris/urethera (approximately 1.5cm away)  Laboratory: none  PP Depression Screening:  EPDS score  zero  Assessment: pt doing well  Plan:  Routine care  Would like to do OCPs. She did well with sprintec in the past so this was sent in to start in two weeks Normal PP care d/w her.   RTC PRN  Cornelia Copa MD Attending Center for [employer] Midwife)

## 2020-04-10 IMAGING — US US MFM OB COMP +14 WKS
1 series · 14 of 28 positions shown · non-contrast
Comparison: none

[Series 1: us mfm ob comp +14 wks · 68 acquisitions, 14 frames shown]
[im 3/68]
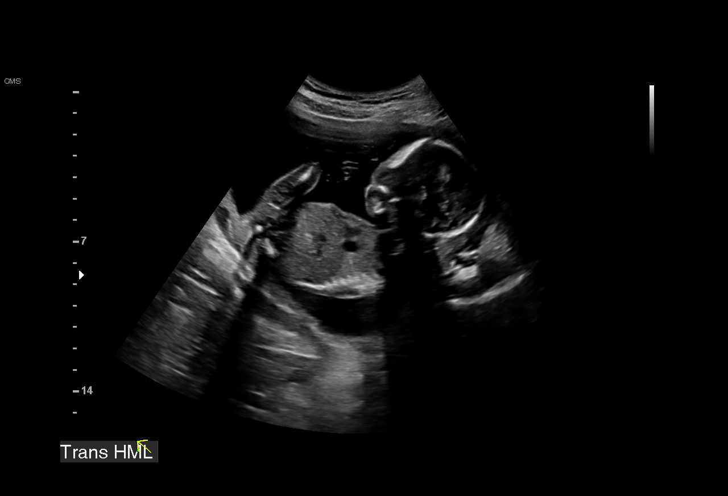
[im 8/68]
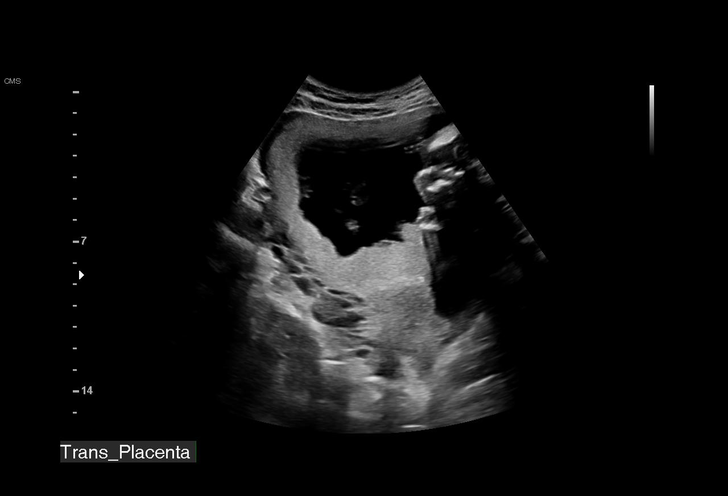
[im 13/68]
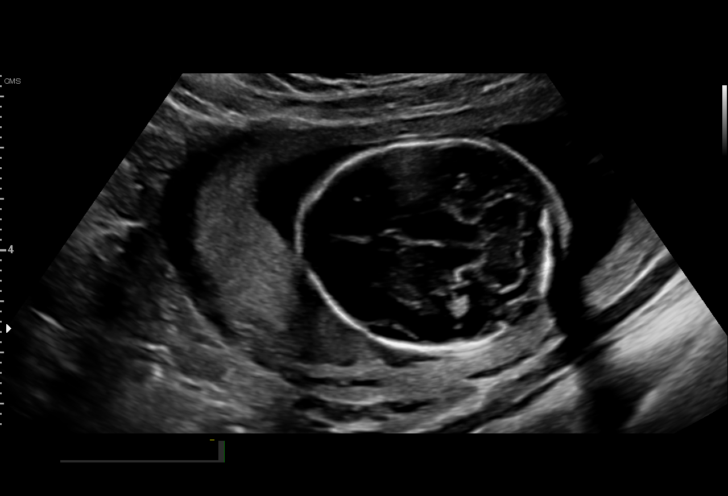
[im 18/68]
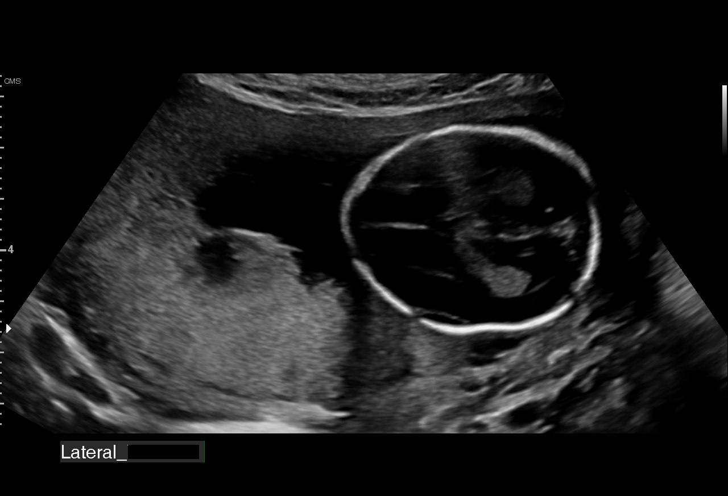
[im 23/68]
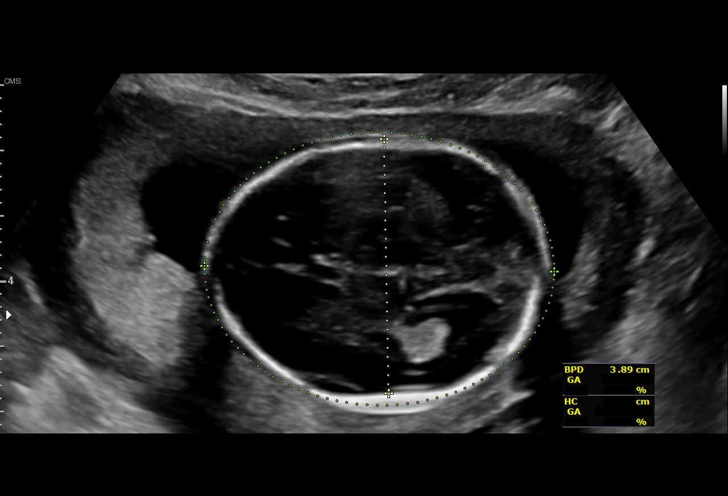
[im 28/68]
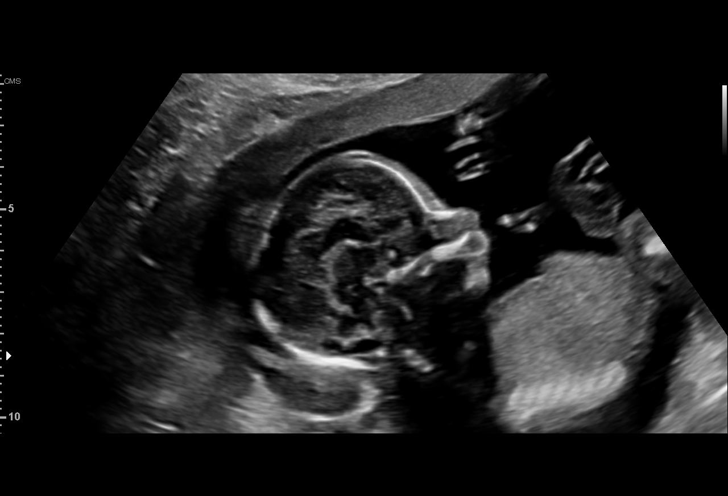
[im 33/68]
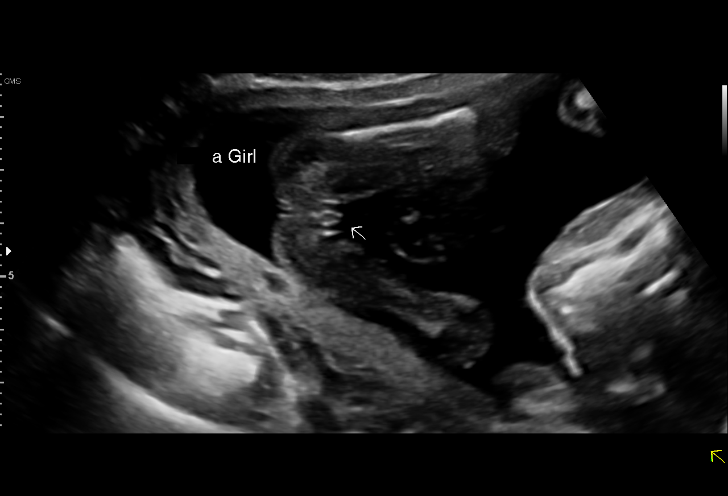
[im 38/68]
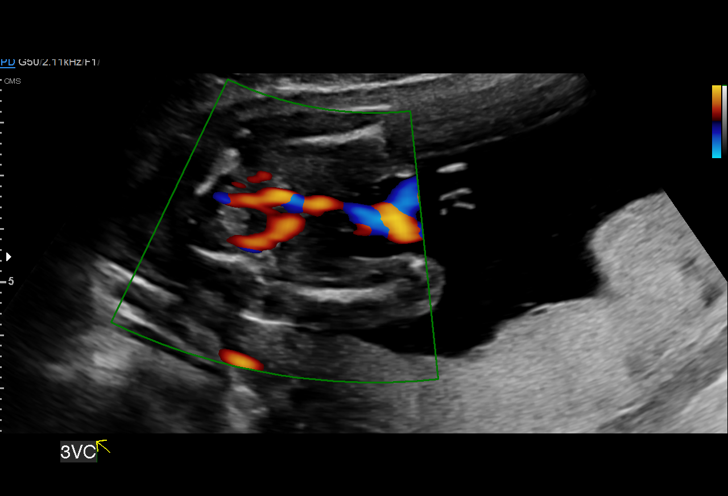
[im 43/68]
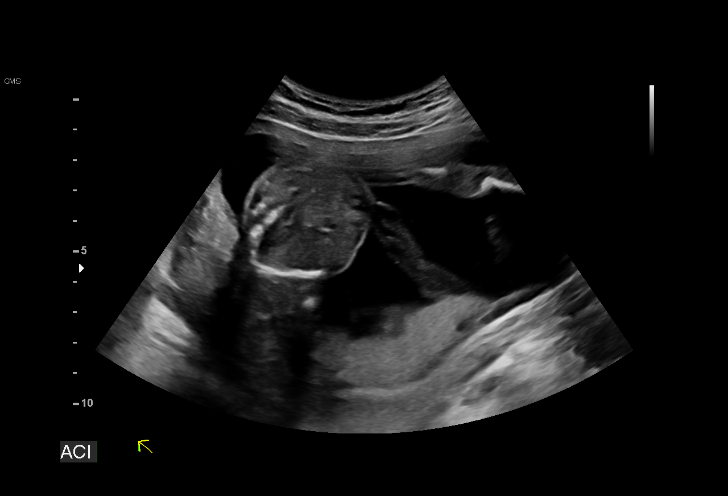
[im 48/68]
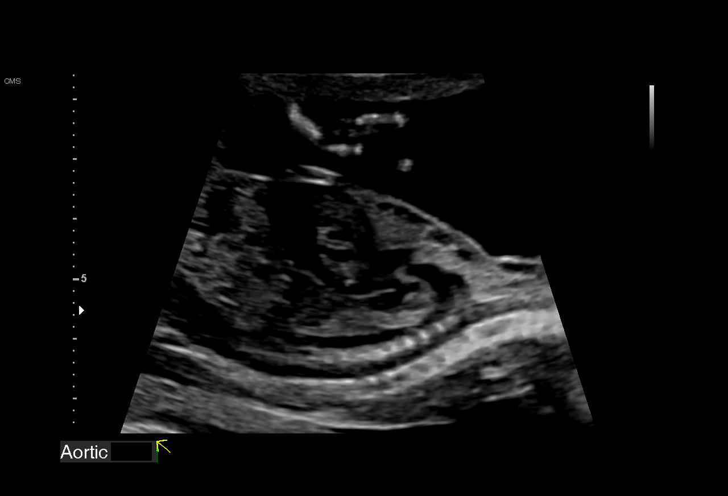
[im 53/68]
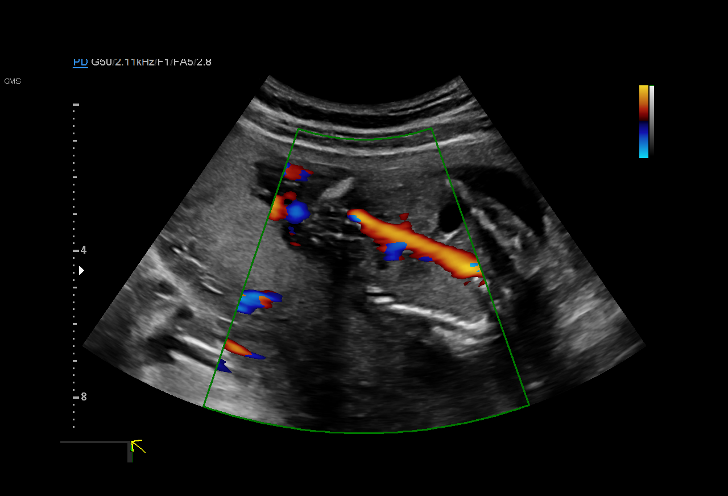
[im 58/68]
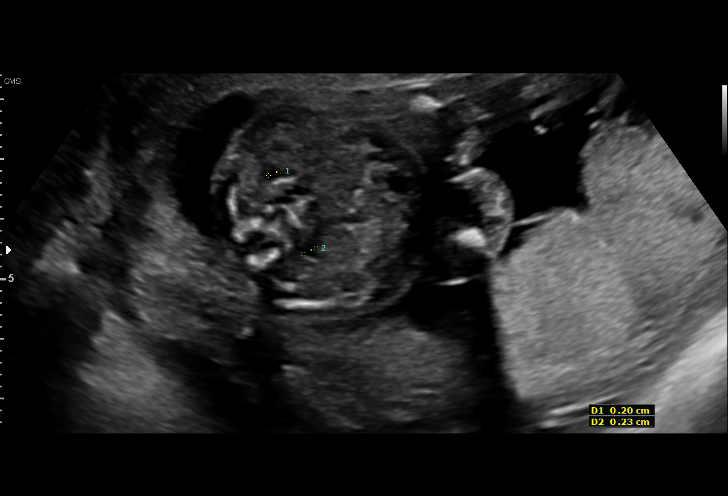
[im 63/68]
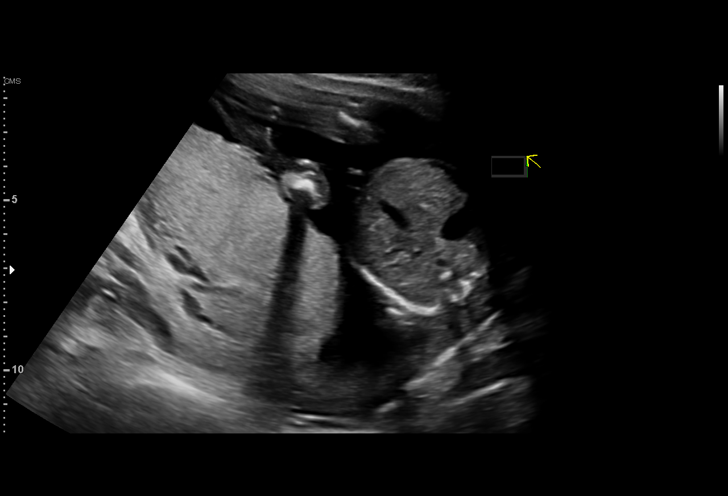
[im 68/68]
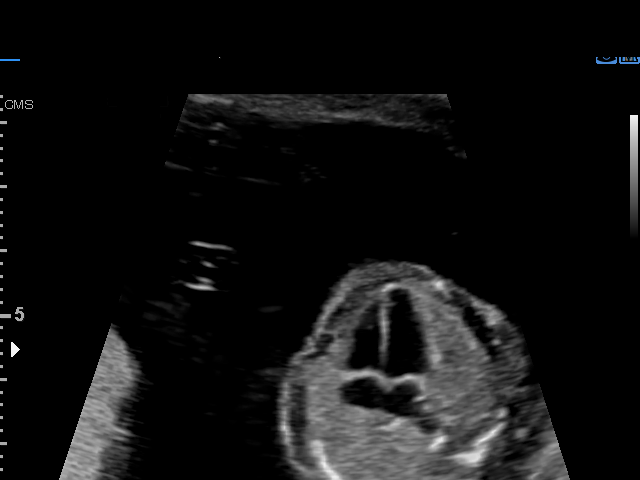

[14 of 28 positions shown; findings below may reference images not displayed]

1  HJ SAMAT SIMAH          [id number]      [id number]     [id number]
Indications

18 weeks gestation of pregnancy
Encounter for antenatal screening for
malformations
OB History

Gravidity:    1         Term:   0        Prem:   0        SAB:   0
TOP:          0       Ectopic:  0        Living: 0
Fetal Evaluation

Num Of Fetuses:     1
Fetal Heart         165
Rate(bpm):
Cardiac Activity:   Observed
Presentation:       Variable
Placenta:           Posterior, above cervical os
P. Cord Insertion:  Visualized

Amniotic Fluid
AFI FV:      Subjectively within normal limits

Largest Pocket(cm)
5.5
Biometry

BPD:      39.5  mm     G. Age:  18w 0d         17  %    CI:        69.69   %    70 - 86
FL/HC:      17.1   %    16.1 -
HC:       151   mm     G. Age:  18w 1d         14  %    HC/AC:      1.07        1.09 -
AC:      140.8  mm     G. Age:  19w 3d         66  %    FL/BPD:     65.3   %
FL:       25.8  mm     G. Age:  17w 6d         13  %    FL/AC:      18.3   %    20 - 24
HUM:      27.1  mm     G. Age:  18w 4d         45  %
CER:      19.3  mm     G. Age:  18w 5d         43  %
NFT:       3.2  mm

CM:        4.3  mm

Est. FW:     249  gm      0 lb 9 oz     40  %
Gestational Age

LMP:           18w 6d        Date:  07/15/17                 EDD:   04/21/18
U/S Today:     18w 3d                                        EDD:   04/24/18
Best:          18w 6d     Det. By:  LMP  (07/15/17)          EDD:   04/21/18
Anatomy

Cranium:               Appears normal         Aortic Arch:            Appears normal
Cavum:                 Appears normal         Ductal Arch:            Appears normal
Ventricles:            Appears normal         Diaphragm:              Not well visualized
Choroid Plexus:        Appears normal         Stomach:                Appears normal, left
sided
Cerebellum:            Appears normal         Abdomen:                Appears normal
Posterior Fossa:       Appears normal         Abdominal Wall:         Appears nml (cord
insert, abd wall)
Nuchal Fold:           Appears normal         Cord Vessels:           Appears normal (3
vessel cord)
Face:                  Orbits nl; profile not Kidneys:                Appear normal
well visualized
Lips:                  Appears normal         Bladder:                Appears normal
Thoracic:              Appears normal         Spine:                  Not well visualized
Heart:                 Appears normal         Upper Extremities:      Appears normal
(4CH, axis, and
situs)
RVOT:                  Not well visualized    Lower Extremities:      Appears normal
LVOT:                  Appears normal

Other:  Female gender. Heels visualized. Nasal bone visualized.
Cervix Uterus Adnexa

Cervix
Length:              4  cm.
Normal appearance by transabdominal scan.

Uterus
No abnormality visualized.

Left Ovary
Within normal limits.

Right Ovary
Not visualized.

Cul De Sac:   No free fluid seen.

Adnexa:       No abnormality visualized.
Impression

Single living intrauterine pregnancy at 18w 6d.
Placenta Posterior, above cervical os.
Appropriate fetal growth.
Normal amniotic fluid volume.
The fetal anatomic survey is not complete.
No gross fetal anomalies identified.
The cervix measures 4cm transabdominally without funneling.
The adnexa appear normal bilaterally without masses.
Recommendations

Recommend follow-up ultrasound examination in 6 weeks to
complete survey.

## 2020-05-27 IMAGING — US US MFM OB FOLLOW-UP
1 series · 14 of 28 positions shown · non-contrast
Comparison: none

[Series 1: us mfm ob follow-up · 53 acquisitions, 14 frames shown]
[im 2/53]
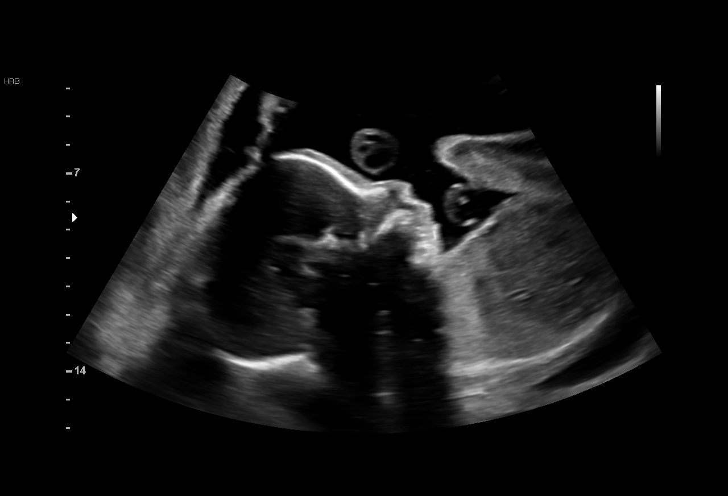
[im 6/53]
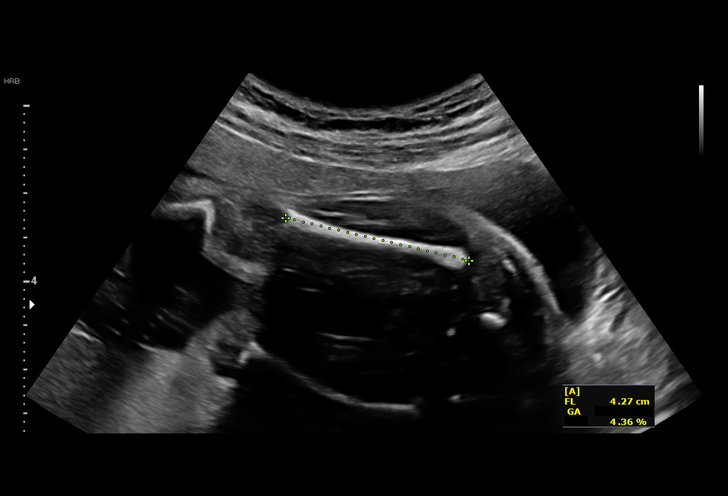
[im 10/53]
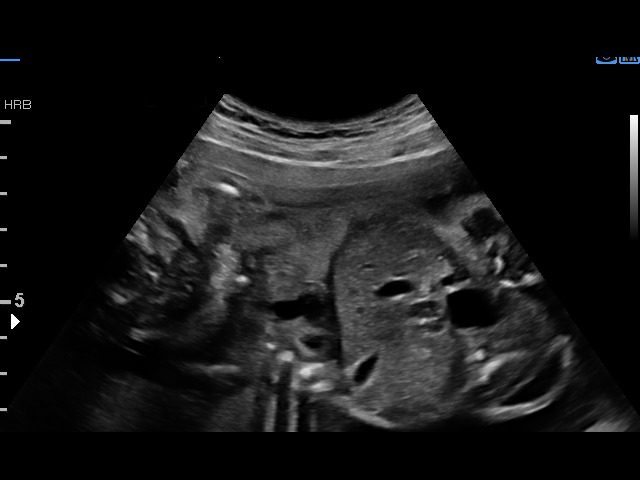
[im 14/53]
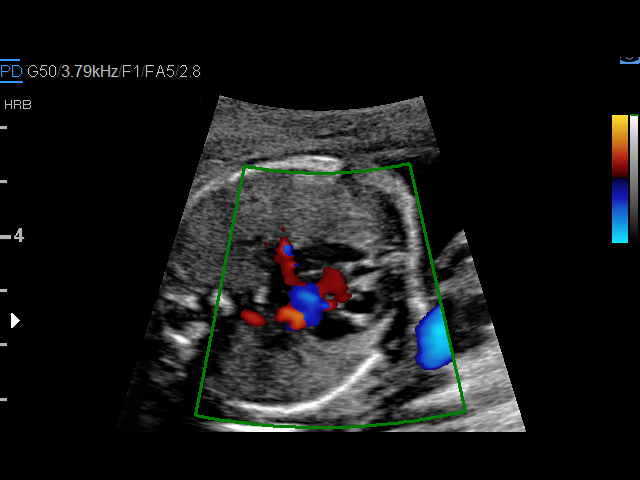
[im 18/53]
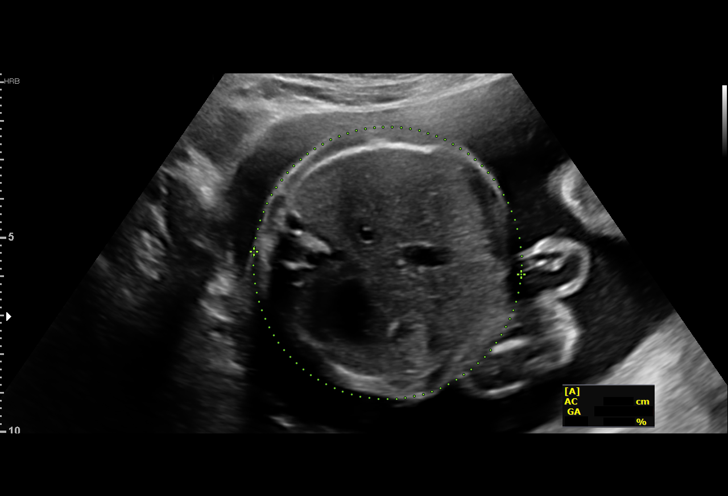
[im 22/53]
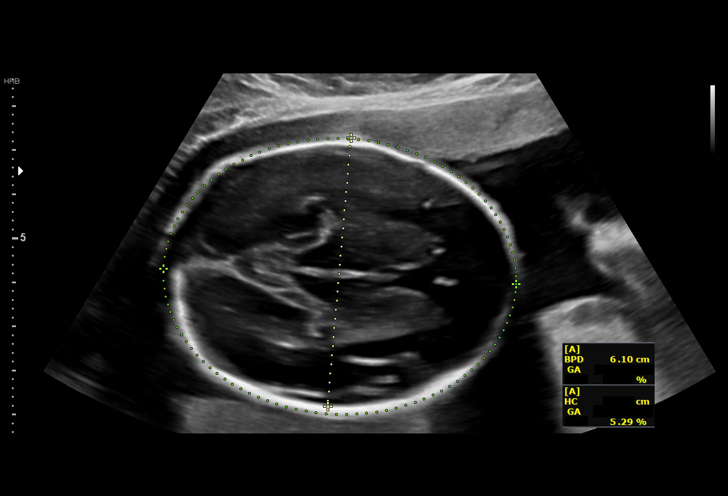
[im 26/53]
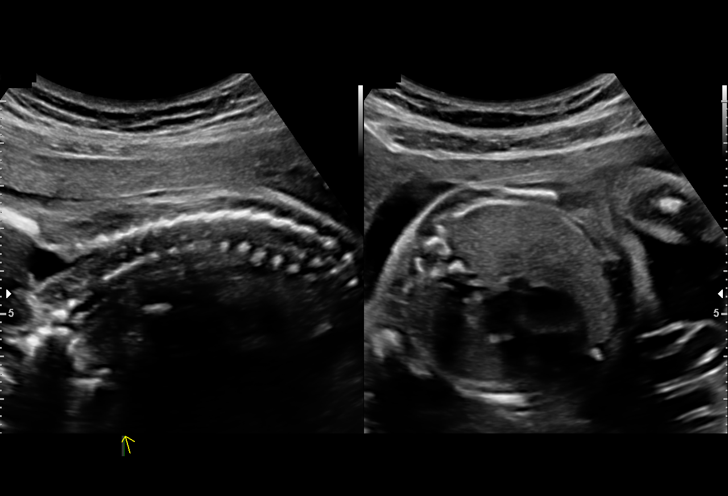
[im 29/53]
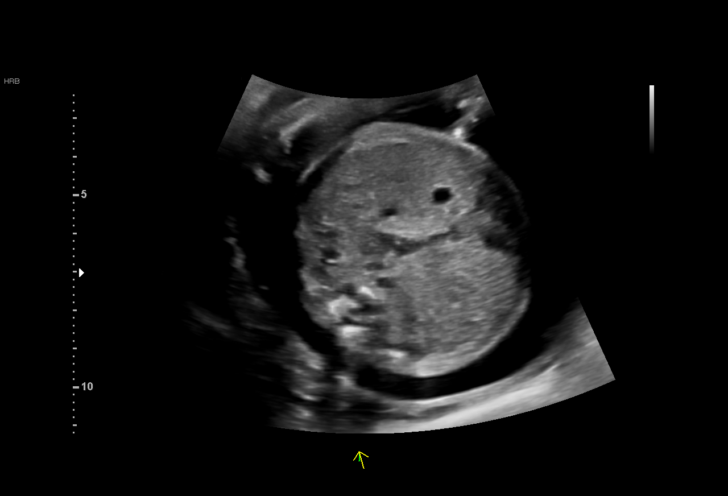
[im 33/53]
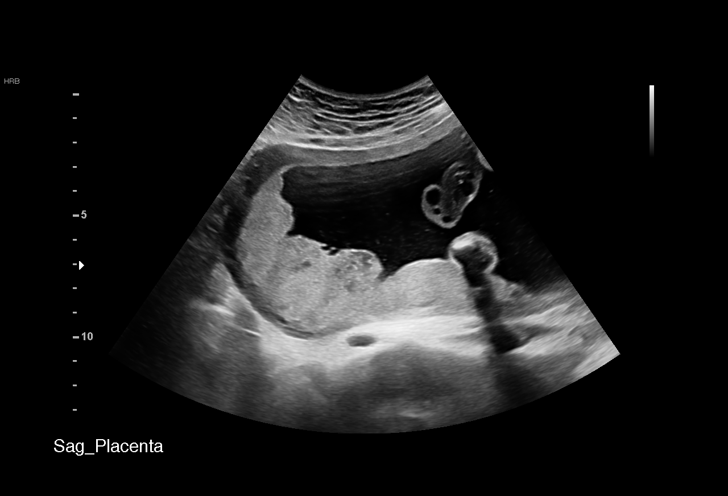
[im 37/53]
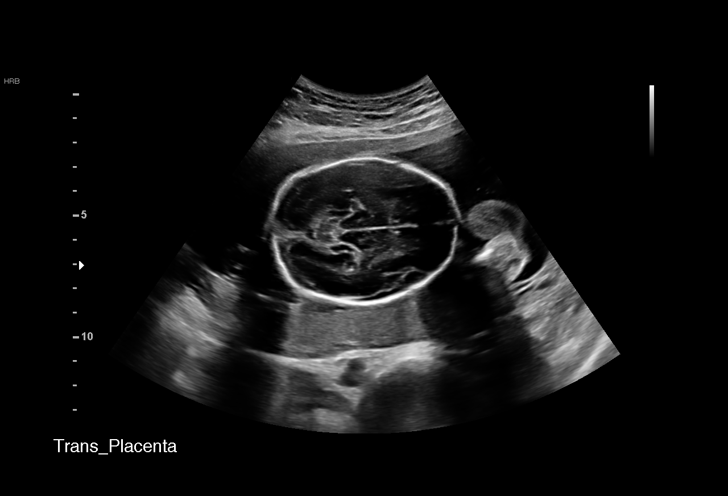
[im 41/53]
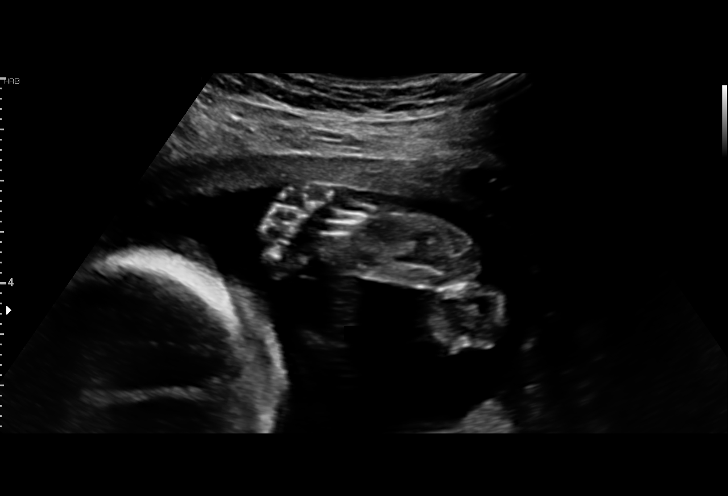
[im 45/53]
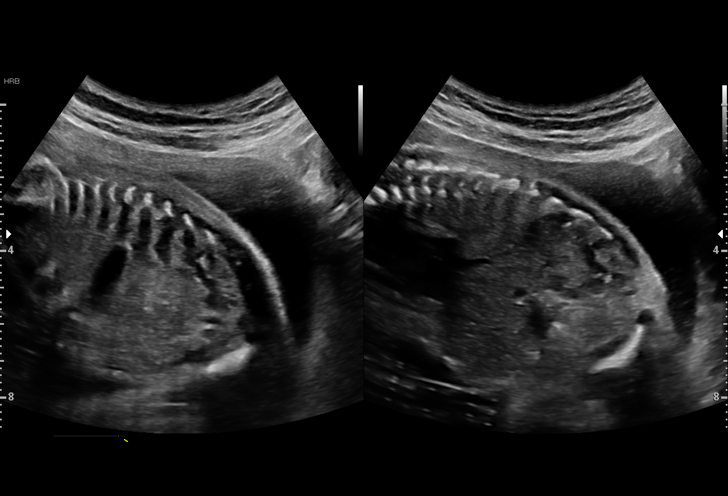
[im 49/53]
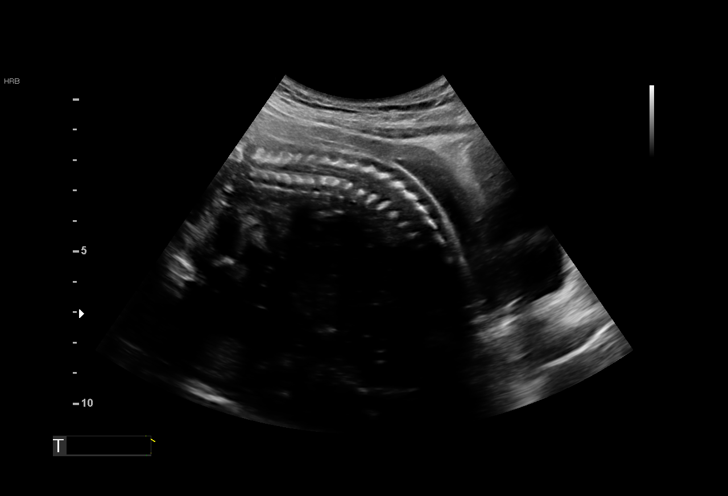
[im 53/53]
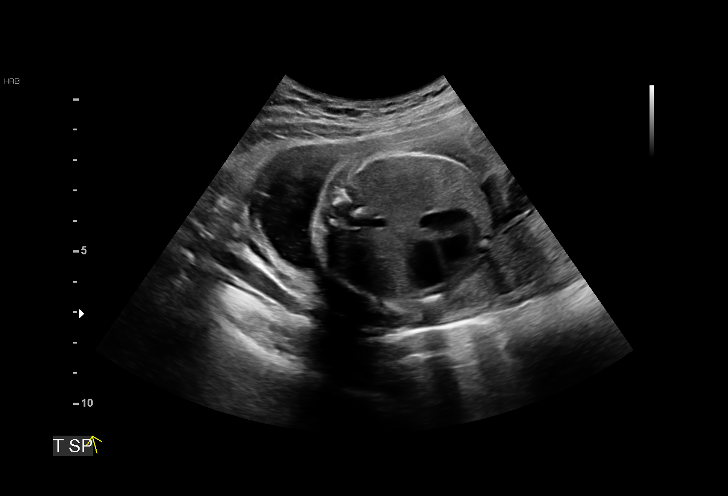

[14 of 28 positions shown; findings below may reference images not displayed]

1  VU SCHOENFELD          [id number]      [id number]     [id number]
Indications

25 weeks gestation of pregnancy
Encounter for other antenatal screening
follow-up
OB History

Gravidity:    1         Term:   0        Prem:   0        SAB:   0
TOP:          0       Ectopic:  0        Living: 0
Fetal Evaluation

Num Of Fetuses:     1
Fetal Heart         167
Rate(bpm):
Cardiac Activity:   Observed
Presentation:       Breech
Placenta:           Posterior, above cervical os
P. Cord Insertion:  Previously Visualized

Amniotic Fluid
AFI FV:      Subjectively within normal limits

Largest Pocket(cm)
5.81
Biometry

BPD:      60.4  mm     G. Age:  24w 4d         13  %    CI:        72.24   %    70 - 86
FL/HC:       18.5  %    18.6 -
HC:      226.1  mm     G. Age:  24w 4d          7  %    HC/AC:       1.06       1.04 -
AC:      212.7  mm     G. Age:  25w 6d         48  %    FL/BPD:      69.2  %    71 - 87
FL:       41.8  mm     G. Age:  23w 4d        < 3  %    FL/AC:       19.7  %    20 - 24
HUM:      40.6  mm     G. Age:  24w 5d         21  %
Est. FW:     739   gm    1 lb 10 oz     36  %
Gestational Age

LMP:           25w 4d        Date:  07/15/17                 EDD:   04/21/18
U/S Today:     24w 5d                                        EDD:   04/27/18
Best:          25w 4d     Det. By:  LMP  (07/15/17)          EDD:   04/21/18
Anatomy

Cranium:               Appears normal         Aortic Arch:            Previously seen
Cavum:                 Appears normal         Ductal Arch:            Previously seen
Ventricles:            Appears normal         Diaphragm:              Appears normal
Choroid Plexus:        Previously seen        Stomach:                Appears normal, left
sided
Cerebellum:            Previously seen        Abdomen:                Appears normal
Posterior Fossa:       Previously seen        Abdominal Wall:         Previously seen
Nuchal Fold:           Previously seen        Cord Vessels:           Previously seen
Face:                  Profile nl; orbits     Kidneys:                Appear normal
previously seen
Lips:                  Previously seen        Bladder:                Appears normal
Thoracic:              Appears normal         Spine:                  Ltd views no
intracranial signs of
NT
Heart:                 Appears normal         Upper Extremities:      Previously seen
(4CH, axis, and
situs)
RVOT:                  Appears normal         Lower Extremities:      Previously seen
LVOT:                  Previously seen

Other:  Female gender. Heels previously visualized. Nasal bone previously
visualized.
Cervix Uterus Adnexa

Cervix
Not visualized (advanced GA >63wks)
Impression

Patient returned for completion of fetal anatomy. On serum
screening, the risk of Down syndrome and open-neural tube
defects are not increased.
Fetal biometry is consistent with her previously-established
dates. Amniotic fluid is normal and good fetal activity is seen.
We reassured the patient of the findings.
Fetal anatomy including spine, profile, RVOT are normal.
Recommendations

Follow-up scans as clinically indicated.

## 2020-06-08 ENCOUNTER — Telehealth: Payer: Self-pay | Admitting: General Practice

## 2020-06-08 NOTE — Telephone Encounter (Signed)
Looks like they had an appt earlier with Dr. Selena Batten and cancelled. Are my new patient appts 2 months out?

## 2020-06-08 NOTE — Telephone Encounter (Signed)
Pt mother- in- law called in wanted to know about getting her daugther-in-law checked out earlier due to she has battling depression.

## 2020-07-29 ENCOUNTER — Ambulatory Visit: Payer: BC Managed Care – PPO | Admitting: Family Medicine

## 2020-08-07 ENCOUNTER — Telehealth: Payer: Self-pay

## 2020-08-07 NOTE — Telephone Encounter (Signed)
Zella Ball (DPR signed) said pt has new pt appt with R Baity NP on 08/14/20 at 3:20 pm and pt tested + covid on 07/29/20;  Zella Ball wants to verify if OK for pt to come for new pt appt on 08/14/20; 08/14/20 will be past the 10 day quarantine and Zella Ball said that pt is not having any covid symptoms now; no fever with no med to reduce fever. Advised pt could keep appt but if symptoms reoccur or new symptoms appear pt or Zella Ball will cb prior to appt. Sending FYI to [facility].

## 2020-08-09 NOTE — Telephone Encounter (Signed)
noted 

## 2020-08-14 ENCOUNTER — Encounter: Payer: Self-pay | Admitting: Internal Medicine

## 2020-08-14 ENCOUNTER — Other Ambulatory Visit: Payer: Self-pay

## 2020-08-14 ENCOUNTER — Ambulatory Visit (INDEPENDENT_AMBULATORY_CARE_PROVIDER_SITE_OTHER): Payer: 59 | Admitting: Internal Medicine

## 2020-08-14 VITALS — BP 100/68 | HR 89 | Temp 96.9°F | Wt 112.0 lb

## 2020-08-14 DIAGNOSIS — M25511 Pain in right shoulder: Secondary | ICD-10-CM

## 2020-08-14 DIAGNOSIS — F320 Major depressive disorder, single episode, mild: Secondary | ICD-10-CM

## 2020-08-14 MED ORDER — SERTRALINE HCL 50 MG PO TABS: 25.0000 mg | ORAL_TABLET | Freq: Every day | ORAL | 2 refills | Status: DC

## 2020-08-14 NOTE — Patient Instructions (Signed)

## 2020-08-14 NOTE — Progress Notes (Signed)
HPI  Patient presents to the clinic today to establish care.  She has not had a PCP in a few years.  She reports right shoulder sprain.  She reports this is a result of a work-related injury.  She went to urgent care earlier today and they prescribed her Voltaren and placed her in a sling.  She does have some concerns about depression.  She has noticed this after the birth of her last son.  She has no thoughts of harming herself or her son at this time.  She is not currently seeing a therapist.  She denies anxiety.  She has never taken medications for depression in the past but would be interested in taking something to help alleviate her symptoms.  Flu: 04/2020 Tetanus: 01/2018 Covid: Moderna x 2 Pap smear: 2020 Dentist screening annually  Past Medical History:  Diagnosis Date  . History of VBAC 04/14/2018   For Breech Discussed TOLAC- 07/10/19 and patient thinking she wants VBAC/TOLAC. She was given consent form to read.  Desires repeat c-section at 39 wks and would like to [facility] if she presents in labor.   . Medical history non-contributory     Current Outpatient Medications  Medication Sig Dispense Refill  . Blood Pressure Monitoring (BLOOD PRESSURE CUFF) MISC 1 Device by Does not apply route once a week. To be monitored weekly from home (Patient not taking: Reported on 10/08/2019) 1 each 0  . norgestimate-ethinyl estradiol (ORTHO-CYCLEN) 0.25-35 MG-MCG tablet Take 1 tablet by mouth daily. 3 Package 3  . Prenatal Vit-Fe Fumarate-FA (PRENATAL MULTIVITAMIN) TABS tablet Take 1 tablet by mouth daily.      No current facility-administered medications for this visit.    No Known Allergies  Family History  Problem Relation Age of Onset  . Heart attack Maternal Grandfather   . Heart disease Maternal Grandfather   . Healthy Mother   . Heart disease Paternal Grandfather   . Stroke Paternal Grandfather     Social History   Socioeconomic History  . Marital status: Married    Spouse  name: Not on file  . Number of children: Not on file  . Years of education: Not on file  . Highest education level: Not on file  Occupational History  . Not on file  Tobacco Use  . Smoking status: Never Smoker  . Smokeless tobacco: Never Used  Vaping Use  . Vaping Use: Never used  Substance and Sexual Activity  . Alcohol use: No  . Drug use: No  . Sexual activity: Yes  Other Topics Concern  . Not on file  Social History Narrative   Married.   Works as a Lawyer for [employer].   She aspires to take classes for nursing or cosmetology.    Social Determinants of Health   Financial Resource Strain: Not on file  Food Insecurity: Not on file  Transportation Needs: Not on file  Physical Activity: Not on file  Stress: Not on file  Social Connections: Not on file  Intimate Partner Violence: Not on file    ROS:  Constitutional: Denies fever, malaise, fatigue, headache or abrupt weight changes.  HEENT: Denies eye pain, eye redness, ear pain, ringing in the ears, wax buildup, runny nose, nasal congestion, bloody nose, or sore throat. Respiratory: Denies difficulty breathing, shortness of breath, cough or sputum production.   Cardiovascular: Denies chest pain, chest tightness, palpitations or swelling in the hands or feet.  Gastrointestinal: Denies abdominal pain, bloating, constipation, diarrhea or blood in the stool.  GU: Denies frequency, urgency, pain with urination, blood in urine, odor or discharge. Musculoskeletal: Patient reports right shoulder pain.  Denies decrease in range of motion, difficulty with gait, muscle pain or joint swelling.  Skin: Denies redness, rashes, lesions or ulcercations.  Neurological: Denies dizziness, difficulty with memory, difficulty with speech or problems with balance and coordination.  Psych: Patient reports depression.  Denies anxiety, SI/HI.  No other specific complaints in a complete review of systems (except as listed in HPI above).  PE:  BP  100/68   Pulse 89   Temp (!) 96.9 F (36.1 C) (Temporal)   Wt 112 lb (50.8 kg)   LMP 07/18/2020   SpO2 98%   BMI 21.87 kg/m   Wt Readings from Last 3 Encounters:  10/08/19 127 lb (57.6 kg)  09/08/19 137 lb (62.1 kg)  08/27/19 133 lb (60.3 kg)    General: Appears her stated age, well developed, well nourished in NAD. Cardiovascular: Normal rate and rhythm.  Pulmonary/Chest: Normal effort and positive vesicular breath sounds. Musculoskeletal: Right arm in sling. Neurological: Alert and oriented.  Psychiatric: Mood and affect mildly flat.  Behavior is normal. Judgment and thought content normal.    BMET    Component Value Date/Time   NA 135 09/06/2019 0852   K 4.2 09/06/2019 0852   CL 103 09/06/2019 0852   CO2 23 09/06/2019 0852   GLUCOSE 77 09/06/2019 0852   BUN 7 09/06/2019 0852   CREATININE 0.58 09/06/2019 0852   CALCIUM 8.9 09/06/2019 0852   GFRNONAA >60 09/06/2019 0852   GFRAA >60 09/06/2019 0852    Lipid Panel  No results found for: CHOL, TRIG, HDL, CHOLHDL, VLDL, LDLCALC  CBC    Component Value Date/Time   WBC 10.1 09/06/2019 0852   RBC 4.02 09/06/2019 0852   HGB 11.6 (L) 09/06/2019 0852   HGB 11.4 06/26/2019 0850   HCT 36.3 09/06/2019 0852   HCT 34.5 06/26/2019 0850   PLT 364 09/06/2019 0852   PLT 309 06/26/2019 0850   MCV 90.3 09/06/2019 0852   MCV 92 06/26/2019 0850   MCH 28.9 09/06/2019 0852   MCHC 32.0 09/06/2019 0852   RDW 12.7 09/06/2019 0852   RDW 11.9 06/26/2019 0850   LYMPHSABS 1.7 02/20/2019 1101   MONOABS 0.4 04/27/2016 1101   EOSABS 0.1 02/20/2019 1101   BASOSABS 0.0 02/20/2019 1101    Hgb A1C No results found for: HGBA1C   Assessment and Plan:  Acute Right Shoulder Pain:  Continue Voltaren and sling as prescribed by urgent care  Make an appointment for your annual exam Nicki Reaper, NP This visit occurred during the SARS-CoV-2 public health emergency.  Safety protocols were in place, including screening questions prior to  the visit, additional usage of staff PPE, and extensive cleaning of exam room while observing appropriate contact time as indicated for disinfecting solutions.

## 2020-08-17 DIAGNOSIS — F320 Major depressive disorder, single episode, mild: Secondary | ICD-10-CM | POA: Insufficient documentation

## 2020-08-17 NOTE — Assessment & Plan Note (Signed)
PHQ-9 score 15 She declines referral for therapy at this time Rx for Sertraline 25 mg p.o. nightly  Update me in 1 month and let me know how you are doing

## 2020-09-17 ENCOUNTER — Other Ambulatory Visit: Payer: Self-pay

## 2020-09-17 MED ORDER — NORGESTIMATE-ETH ESTRADIOL 0.25-35 MG-MCG PO TABS: 1.0000 | ORAL_TABLET | Freq: Every day | ORAL | 0 refills | Status: DC

## 2020-09-17 NOTE — Telephone Encounter (Signed)
Pt requesting refill on b/c to [facility] on [address]. Pt scheduled for annual exam

## 2020-10-22 ENCOUNTER — Ambulatory Visit: Payer: 59 | Admitting: Obstetrics & Gynecology

## 2020-11-24 ENCOUNTER — Other Ambulatory Visit (HOSPITAL_COMMUNITY)
Admission: RE | Admit: 2020-11-24 | Discharge: 2020-11-24 | Disposition: A | Discharge status: Home / Self Care | Payer: 59 | Source: Ambulatory Visit | Attending: Obstetrics & Gynecology | Admitting: Obstetrics & Gynecology

## 2020-11-24 ENCOUNTER — Other Ambulatory Visit: Payer: Self-pay

## 2020-11-24 ENCOUNTER — Ambulatory Visit (INDEPENDENT_AMBULATORY_CARE_PROVIDER_SITE_OTHER): Payer: 59 | Admitting: Obstetrics & Gynecology

## 2020-11-24 ENCOUNTER — Encounter: Payer: Self-pay | Admitting: Obstetrics & Gynecology

## 2020-11-24 VITALS — BP 101/67 | HR 76 | Ht 61.0 in | Wt 116.0 lb

## 2020-11-24 DIAGNOSIS — Z01419 Encounter for gynecological examination (general) (routine) without abnormal findings: Secondary | ICD-10-CM | POA: Diagnosis not present

## 2020-11-24 DIAGNOSIS — Z113 Encounter for screening for infections with a predominantly sexual mode of transmission: Secondary | ICD-10-CM

## 2020-11-24 DIAGNOSIS — Z3041 Encounter for surveillance of contraceptive pills: Secondary | ICD-10-CM | POA: Diagnosis not present

## 2020-11-24 MED ORDER — NORGESTIMATE-ETH ESTRADIOL 0.25-35 MG-MCG PO TABS: 1.0000 | ORAL_TABLET | Freq: Every day | ORAL | 5 refills | Status: DC

## 2020-11-24 NOTE — Patient Instructions (Signed)
Preventive Care 21-24 Years Old, Female Preventive care refers to lifestyle choices and visits with your health care provider that can promote health and wellness. This includes:  A yearly physical exam. This is also called an annual wellness visit.  Regular dental and eye exams.  Immunizations.  Screening for certain conditions.  Healthy lifestyle choices, such as: ? Eating a healthy diet. ? Getting regular exercise. ? Not using drugs or products that contain nicotine and tobacco. ? Limiting alcohol use. What can I expect for my preventive care visit? Physical exam Your health care provider may check your:  Height and weight. These may be used to calculate your BMI (body mass index). BMI is a measurement that tells if you are at a healthy weight.  Heart rate and blood pressure.  Body temperature.  Skin for abnormal spots. Counseling Your health care provider may ask you questions about your:  Past medical problems.  Family's medical history.  Alcohol, tobacco, and drug use.  Emotional well-being.  Home life and relationship well-being.  Sexual activity.  Diet, exercise, and sleep habits.  Work and work environment.  Access to firearms.  Method of birth control.  Menstrual cycle.  Pregnancy history. What immunizations do I need? Vaccines are usually given at various ages, according to a schedule. Your health care provider will recommend vaccines for you based on your age, medical history, and lifestyle or other factors, such as travel or where you work.   What tests do I need? Blood tests  Lipid and cholesterol levels. These may be checked every 5 years starting at age 20.  Hepatitis C test.  Hepatitis B test. Screening  Diabetes screening. This is done by checking your blood sugar (glucose) after you have not eaten for a while (fasting).  STD (sexually transmitted disease) testing, if you are at risk.  BRCA-related cancer screening. This may be  done if you have a family history of breast, ovarian, tubal, or peritoneal cancers.  Pelvic exam and Pap test. This may be done every 3 years starting at age 21. Starting at age 30, this may be done every 5 years if you have a Pap test in combination with an HPV test. Talk with your health care provider about your test results, treatment options, and if necessary, the need for more tests.   Follow these instructions at home: Eating and drinking  Eat a healthy diet that includes fresh fruits and vegetables, whole grains, lean protein, and low-fat dairy products.  Take vitamin and mineral supplements as recommended by your health care provider.  Do not drink alcohol if: ? Your health care provider tells you not to drink. ? You are pregnant, may be pregnant, or are planning to become pregnant.  If you drink alcohol: ? Limit how much you have to 0-1 drink a day. ? Be aware of how much alcohol is in your drink. In the [place] one drink equals one 12 oz bottle of beer (355 mL), one 5 oz glass of wine (148 mL), or one 1 oz glass of hard liquor (44 mL).   Lifestyle  Take daily care of your teeth and gums. Brush your teeth every morning and night with fluoride toothpaste. Floss one time each day.  Stay active. Exercise for at least 30 minutes 5 or more days each week.  Do not use any products that contain nicotine or tobacco, such as cigarettes, e-cigarettes, and chewing tobacco. If you need help quitting, ask your health care provider.  Do not   use drugs.  If you are sexually active, practice safe sex. Use a condom or other form of protection to prevent STIs (sexually transmitted infections).  If you do not wish to become pregnant, use a form of birth control. If you plan to become pregnant, see your health care provider for a prepregnancy visit.  Find healthy ways to cope with stress, such as: ? Meditation, yoga, or listening to music. ? Journaling. ? Talking to a trusted  person. ? Spending time with friends and family. Safety  Always wear your seat belt while driving or riding in a vehicle.  Do not drive: ? If you have been drinking alcohol. Do not ride with someone who has been drinking. ? When you are tired or distracted. ? While texting.  Wear a helmet and other protective equipment during sports activities.  If you have firearms in your house, make sure you follow all gun safety procedures.  Seek help if you have been physically or sexually abused. What's next?  Go to your health care provider once a year for an annual wellness visit.  Ask your health care provider how often you should have your eyes and teeth checked.  Stay up to date on all vaccines. This information is not intended to replace advice given to you by your health care provider. Make sure you discuss any questions you have with your health care provider. Document Revised: 02/23/2020 Document Reviewed: 03/08/2018 Elsevier Patient Education  2021 Elsevier Inc.  

## 2020-11-24 NOTE — Progress Notes (Signed)
GYNECOLOGY ANNUAL PREVENTATIVE CARE ENCOUNTER NOTE  History:     Dashley Monts is a 25 y.o. [phone] female here for a routine annual gynecologic exam.  Current complaints: none.   Denies abnormal vaginal bleeding, discharge, pelvic pain, problems with intercourse or other gynecologic concerns.    Gynecologic History Patient's last menstrual period was 11/15/2020 (approximate). Contraception: OCP (estrogen/progesterone) Last Pap: 07/18/2018. Results were: normal  No history of STIs.  Obstetric History OB History  Gravida Para Term Preterm AB Living  2 2 2  0 0 2  SAB IAB Ectopic Multiple Live Births  0 0 0 0 2    # Outcome Date GA Lbr Len/2nd Weight Sex Delivery Anes PTL Lv  2 Term 09/08/19 [redacted]w[redacted]d 05:19 / 00:37 7 lb 9.5 oz (3.445 kg) M Vag-Spont None  LIV  1 Term 04/14/18 [redacted]w[redacted]d  6 lb 1 oz (2.75 kg) F CS-LTranv  N LIV     Complications: Breech presentation, antepartum    Past Medical History:  Diagnosis Date  . History of VBAC 04/14/2018   For Breech Discussed TOLAC- 07/10/19 and patient thinking she wants VBAC/TOLAC. She was given consent form to read.  Desires repeat c-section at 39 wks and would like to [facility] if she presents in labor.   . Medical history non-contributory     Past Surgical History:  Procedure Laterality Date  . CESAREAN SECTION N/A 04/14/2018   Procedure: CESAREAN SECTION;  Surgeon: 06/14/2018, MD;  Location: [facility] BIRTHING SUITES;  Service: Obstetrics;  Laterality: N/A;    Current Outpatient Medications on File Prior to Visit  Medication Sig Dispense Refill  . sertraline (ZOLOFT) 50 MG tablet Take 0.5 tablets (25 mg total) by mouth at bedtime. 30 tablet 2   No current facility-administered medications on file prior to visit.    No Known Allergies  Social History:  reports that she has never smoked. She has never used smokeless tobacco. She reports that she does not drink alcohol and does not use drugs.  Family History  Problem Relation  Age of Onset  . Heart attack Maternal Grandfather   . Heart disease Maternal Grandfather   . Healthy Mother   . Heart disease Paternal Grandfather   . Stroke Paternal Grandfather     The following portions of the patient's history were reviewed and updated as appropriate: allergies, current medications, past family history, past medical history, past social history, past surgical history and problem list.  Review of Systems Pertinent items noted in HPI and remainder of comprehensive ROS otherwise negative.  Physical Exam:  BP 101/67   Pulse 76   Ht 5\' 1"  (1.549 m)   Wt 116 lb (52.6 kg)   LMP 11/15/2020 (Approximate)   BMI 21.92 kg/m  CONSTITUTIONAL: Well-developed, well-nourished female in no acute distress.  HENT:  Normocephalic, atraumatic, External right and left ear normal.  EYES: Conjunctivae and EOM are normal. Pupils are equal, round, and reactive to light. No scleral icterus.  NECK: Normal range of motion, supple, no masses.  Normal thyroid.  SKIN: Skin is warm and dry. No rash noted. Not diaphoretic. No erythema. No pallor. MUSCULOSKELETAL: Normal range of motion. No tenderness.  No cyanosis, clubbing, or edema. NEUROLOGIC: Alert and oriented to person, place, and time. Normal reflexes, muscle tone coordination.  PSYCHIATRIC: Normal mood and affect. Normal behavior. Normal judgment and thought content. CARDIOVASCULAR: Normal heart rate noted, regular rhythm RESPIRATORY: Clear to auscultation bilaterally. Effort and breath sounds normal, no problems with respiration noted.  BREASTS: Symmetric in size. No masses, tenderness, skin changes, nipple drainage, or lymphadenopathy bilaterally. Performed in the presence of a chaperone. ABDOMEN: Soft, no distention noted.  No tenderness, rebound or guarding.  PELVIC: Deferred   Assessment and Plan:      1. Uses oral contraception OCPs refilled, satisfied with method. - norgestimate-ethinyl estradiol (ORTHO-CYCLEN) 0.25-35 MG-MCG  tablet; Take 1 tablet by mouth daily.  Dispense: 84 tablet; Refill: 5  2. Routine screening for STI (sexually transmitted infection) GC/Chlam testing done today. Declines other STI screens. - Cervicovaginal ancillary only  3. Well woman exam with routine gynecological exam Up to date on pap smear screening, will get one next year. Normal breast examination. Routine preventative health maintenance measures emphasized. Please refer to After Visit Summary for other counseling recommendations.      Jaynie Collins, MD, FACOG Obstetrician & Gynecologist, [facility] [employer], [facility]

## 2020-11-26 LAB — CERVICOVAGINAL ANCILLARY ONLY
Chlamydia: NEGATIVE
Comment: NEGATIVE
Comment: NORMAL
Neisseria Gonorrhea: NEGATIVE

## 2021-02-08 HISTORY — PX: FOOT SURGERY: SHX648

## 2021-02-19 ENCOUNTER — Ambulatory Visit (INDEPENDENT_AMBULATORY_CARE_PROVIDER_SITE_OTHER): Payer: 59 | Admitting: Podiatry

## 2021-02-19 ENCOUNTER — Ambulatory Visit (INDEPENDENT_AMBULATORY_CARE_PROVIDER_SITE_OTHER): Payer: 59

## 2021-02-19 ENCOUNTER — Other Ambulatory Visit: Payer: Self-pay

## 2021-02-19 ENCOUNTER — Other Ambulatory Visit: Payer: Self-pay | Admitting: Podiatry

## 2021-02-19 ENCOUNTER — Encounter: Payer: Self-pay | Admitting: Podiatry

## 2021-02-19 DIAGNOSIS — M21619 Bunion of unspecified foot: Secondary | ICD-10-CM | POA: Diagnosis not present

## 2021-02-19 DIAGNOSIS — M2021 Hallux rigidus, right foot: Secondary | ICD-10-CM

## 2021-02-19 DIAGNOSIS — M2022 Hallux rigidus, left foot: Secondary | ICD-10-CM | POA: Diagnosis not present

## 2021-02-19 DIAGNOSIS — M21621 Bunionette of right foot: Secondary | ICD-10-CM

## 2021-02-19 DIAGNOSIS — M21622 Bunionette of left foot: Secondary | ICD-10-CM | POA: Diagnosis not present

## 2021-02-19 NOTE — Patient Instructions (Signed)

## 2021-02-22 NOTE — Progress Notes (Signed)
Subjective:   Patient ID: Alicia Rich, female   DOB: 25 y.o.   MRN: [id number]   HPI Patient presents with significant bunion pain and states the left is gotten worse than the right with family history of condition.  Patient does have to wear tighter shoes and is having trouble with activity and has tried wider shoes soaks oral anti-inflammatories without relief and patient does not smoke likes to be active   Review of Systems  All other systems reviewed and are negative.      Objective:  Physical Exam Vitals and nursing note reviewed.  Constitutional:      Appearance: She is well-developed.  Pulmonary:     Effort: Pulmonary effort is normal.  Musculoskeletal:        General: Normal range of motion.  Skin:    General: Skin is warm.  Neurological:     Mental Status: She is alert.    Neurovascular status intact muscle strength found to be adequate range of motion adequate with exquisite discomfort around the first MPJ left over right and also rotation of the left hallux with keratotic lesion on the plantar surface secondary to movement of the big toe left over right.  Patient is found to have good digital perfusion is well oriented x3     Assessment:  Structural bunion deformity left with redness around the first metatarsal head along with rotation of the big toe left over right with structural deformity     Plan:  H&P reviewed x-rays and condition and discussed the structural malalignment she is experiencing.  She has had numerous conservative treatments its been getting worse and she is young so I do think surgical intervention is indicated and I did discuss distal osteotomy along with Akin osteotomy.  I did review what would be required surgery recovery and patient wants surgery understanding recovery and is willing to accept risk.  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

## 2021-03-03 ENCOUNTER — Ambulatory Visit: Payer: 59 | Admitting: Podiatry

## 2021-03-05 ENCOUNTER — Ambulatory Visit (INDEPENDENT_AMBULATORY_CARE_PROVIDER_SITE_OTHER): Payer: 59 | Admitting: Podiatry

## 2021-03-05 ENCOUNTER — Encounter: Payer: Self-pay | Admitting: Podiatry

## 2021-03-05 ENCOUNTER — Other Ambulatory Visit: Payer: Self-pay

## 2021-03-05 ENCOUNTER — Ambulatory Visit: Payer: 59 | Admitting: Podiatry

## 2021-03-05 DIAGNOSIS — M2012 Hallux valgus (acquired), left foot: Secondary | ICD-10-CM | POA: Diagnosis not present

## 2021-03-05 DIAGNOSIS — M21619 Bunion of unspecified foot: Secondary | ICD-10-CM | POA: Diagnosis not present

## 2021-03-05 NOTE — Progress Notes (Signed)
Subjective:   Patient ID: Alicia Rich, female   DOB: 25 y.o.   MRN: [id number]   HPI Patient presents stating she is ready to get her left foot fixed and wants to discuss what will be required stating its been very sore and hard to wear shoe gear with.  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  Patient wants surgery after reviewing and is allowed to read consent form going over alternative treatments complications.  Patient scheduled for outpatient surgery at this time is encouraged to call with questions concerns which may arise and I allowed her to read and signed understanding all complications as outlined in the total recovery takes approximately 6 months.  Patient wants surgery and is scheduled with air fracture walker dispensed today that I want her to get used to prior to procedure and is encouraged to call questions concerns which may arise prior to surgery  X-rays reviewed today did indicate elevation of the intermetatarsal angle left of approximate 16 degrees and deviation of the hallux against the second toe

## 2021-03-08 DIAGNOSIS — M79676 Pain in unspecified toe(s): Secondary | ICD-10-CM

## 2021-03-12 ENCOUNTER — Telehealth: Payer: Self-pay | Admitting: Urology

## 2021-03-12 NOTE — Telephone Encounter (Signed)
DOS - 03/23/21  DOUBLE OSTEOTOMY LEFT --- 92957  [employer] EFFECTIVE DATE - 01/09/20 [employer] EFFECTIVE DATE -    PLAN DEDUCTIBLE - $0.00  OUT OF POCKET - Member's individual out-of-pocket maximum has no limit. COINSURANCE - 0% COPAY - $0.00   SPOKE WITH JILL WITH [employer] AND SHE STATED THAT FOR CPT CODE 47340 NO PRIOR AUTH IS REQUIRED.   REF # DON'T DO THAT  PER [employer] CPT CODE 37096 HAS BEEN APPROVED, AUTH # [id number], GOOD FROM 03/23/21 - 06/09/21.

## 2021-03-22 MED ORDER — OXYCODONE-ACETAMINOPHEN 10-325 MG PO TABS: 1.0000 | ORAL_TABLET | ORAL | 0 refills | Status: DC | PRN

## 2021-03-22 MED ORDER — ONDANSETRON HCL 4 MG PO TABS: 4.0000 mg | ORAL_TABLET | Freq: Three times a day (TID) | ORAL | 0 refills | Status: DC | PRN

## 2021-03-22 NOTE — Addendum Note (Signed)
Addended by: Lenn Sink on: 03/22/2021 05:05 PM   Modules accepted: Orders

## 2021-03-23 ENCOUNTER — Encounter: Payer: Self-pay | Admitting: Podiatry

## 2021-03-23 DIAGNOSIS — M2011 Hallux valgus (acquired), right foot: Secondary | ICD-10-CM

## 2021-03-24 ENCOUNTER — Telehealth: Payer: Self-pay | Admitting: Podiatry

## 2021-03-24 NOTE — Telephone Encounter (Signed)
Patient called the office wanting to know if she can take her boot off while laying down , she states she is feel some discomfort from her boot.

## 2021-03-24 NOTE — Telephone Encounter (Signed)
yes

## 2021-03-24 NOTE — Telephone Encounter (Signed)
I called the patient to let know that she can remove her boot while laying down.

## 2021-03-29 ENCOUNTER — Ambulatory Visit (INDEPENDENT_AMBULATORY_CARE_PROVIDER_SITE_OTHER): Payer: 59

## 2021-03-29 ENCOUNTER — Encounter: Payer: Self-pay | Admitting: Podiatry

## 2021-03-29 ENCOUNTER — Other Ambulatory Visit: Payer: Self-pay

## 2021-03-29 ENCOUNTER — Ambulatory Visit (INDEPENDENT_AMBULATORY_CARE_PROVIDER_SITE_OTHER): Payer: 59 | Admitting: Podiatry

## 2021-03-29 DIAGNOSIS — Z9889 Other specified postprocedural states: Secondary | ICD-10-CM

## 2021-03-29 NOTE — Progress Notes (Signed)
Subjective:   Patient ID: Alicia Rich, female   DOB: 25 y.o.   MRN: [id number]   HPI Patient states she is doing well with her surgery and very pleased currently.  She is using a rollabout and not bearing weight on her foot neuro   ROS      Objective:  Physical Exam  Vascular status intact negative Homans' sign noted structurally the first MPJ left looks good wound edges are well coapted good range of motion good alignment of the big toe     Assessment:  Doing well post osteotomy first metatarsal left     Plan:  H&P reviewed condition recommended the continuation of conservative care consisting of immobilization elevation compression and reapplied sterile dressing.  Instructed on gradually getting the foot wet and also dispensed surgical shoe which she may start wearing  X-rays indicate the osteotomy is healing well alignment good fixation in place

## 2021-04-19 ENCOUNTER — Encounter: Payer: Self-pay | Admitting: Podiatry

## 2021-04-19 ENCOUNTER — Ambulatory Visit (INDEPENDENT_AMBULATORY_CARE_PROVIDER_SITE_OTHER): Payer: 59

## 2021-04-19 ENCOUNTER — Other Ambulatory Visit: Payer: Self-pay

## 2021-04-19 ENCOUNTER — Ambulatory Visit (INDEPENDENT_AMBULATORY_CARE_PROVIDER_SITE_OTHER): Payer: 59 | Admitting: Podiatry

## 2021-04-19 DIAGNOSIS — Z9889 Other specified postprocedural states: Secondary | ICD-10-CM

## 2021-04-19 NOTE — Progress Notes (Signed)
Subjective:   Patient ID: Alicia Rich, female   DOB: 25 y.o.   MRN: [id number]   HPI Patient presents stating doing very well with her left foot very pleased with minimal discomfort and starting to walk more pain-free   ROS      Objective:  Physical Exam  Neurovascular status intact with well-healing surgical site first metatarsal left good alignment noted incision sites healing well well coapted     Assessment:  Doing well post osteotomy first metatarsal left foot     Plan:  H&P x-rays reviewed and allow patient to return to soft shoe gear with instructions for range of motion exercises compression and dispensed ankle compression stocking.  Reappoint as needed and should do fine with this long-term  X-rays indicate that there is good healing of the osteotomy the fixation is in place joint congruence

## 2021-06-14 ENCOUNTER — Encounter: Payer: Self-pay | Admitting: Podiatry

## 2021-07-08 ENCOUNTER — Telehealth: Payer: Self-pay

## 2021-07-08 NOTE — Telephone Encounter (Signed)
Pt returned call regarding triage vm  Pt c/o irregular bleeding w/ BCP's advised breakthrough bleeding can occur with use of contraception  notes this is first episode.  Pt states she had missed 6 pills last month Pt advised to see how see does next month with staying on track with pills and that skipped doses may be causing bleeding.  pt noted having alarm set as a reminder  If still having irregular bleeding next month pt advised to discuss w/ a provider Pt agreeable.

## 2021-07-08 NOTE — Telephone Encounter (Signed)
Return call to pt regarding triage vm  C/o bleeding with birth control pills  No answer LVM

## 2021-09-24 IMAGING — US US MFM OB COMP +14 WKS
1 series · 12 of 28 positions shown · non-contrast
Comparison: none

[Series 1: us mfm ob comp +14 wks · 12 of 115 slices shown]
[im 5/115]
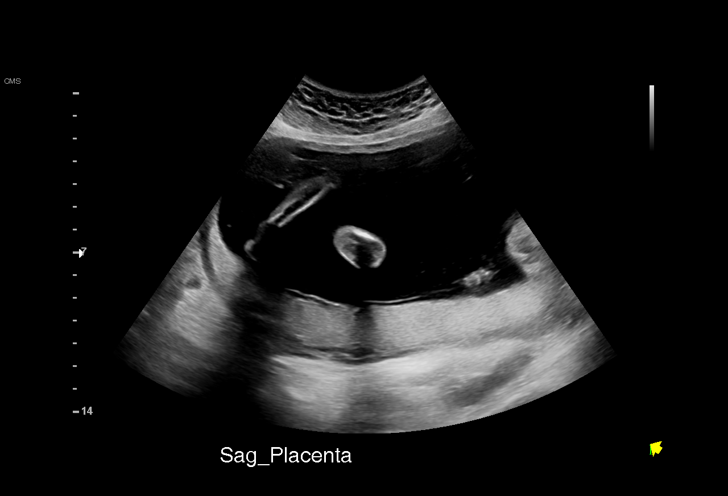
[im 13/115]
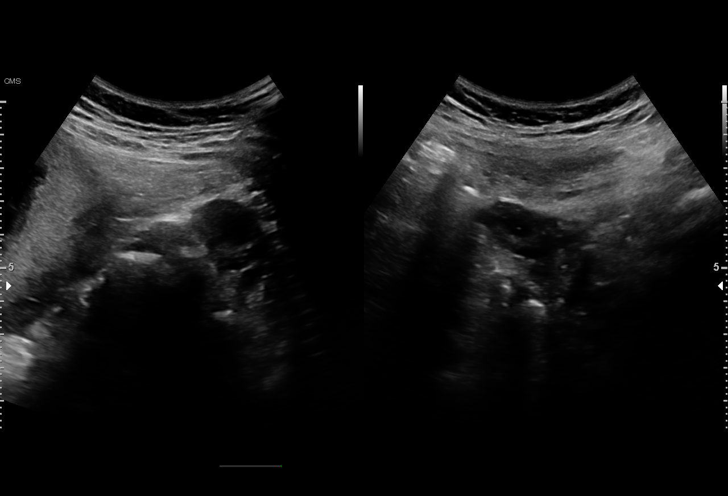
[im 22/115]
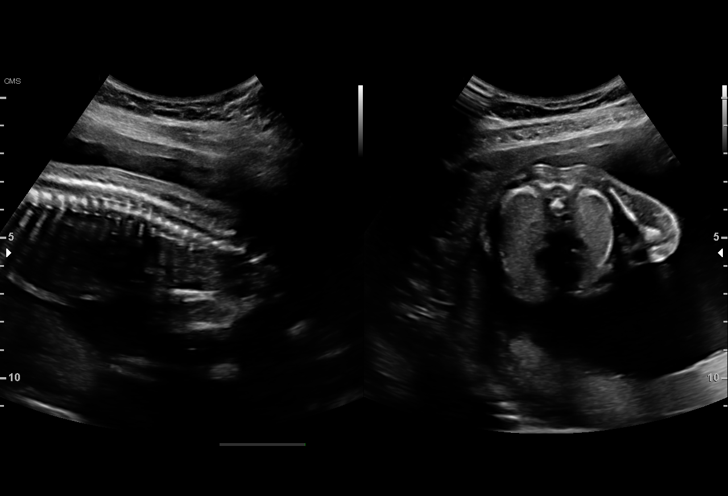
[im 34/115]
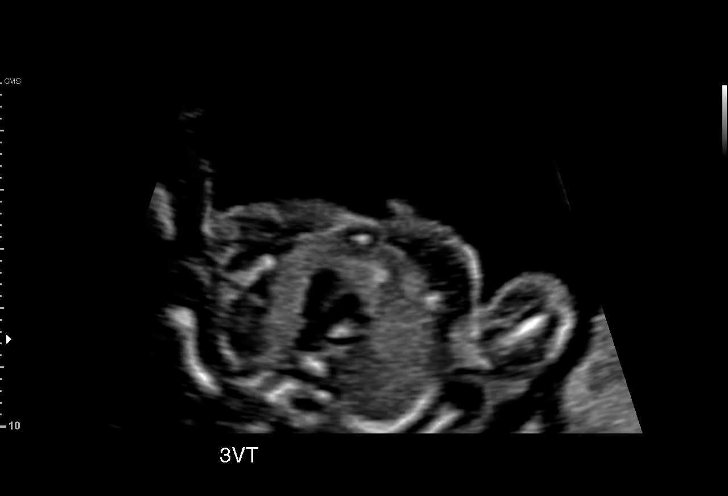
[im 43/115]
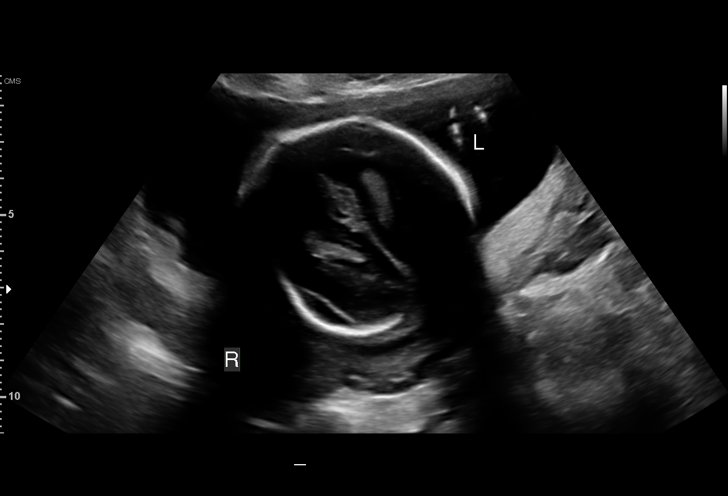
[im 51/115]
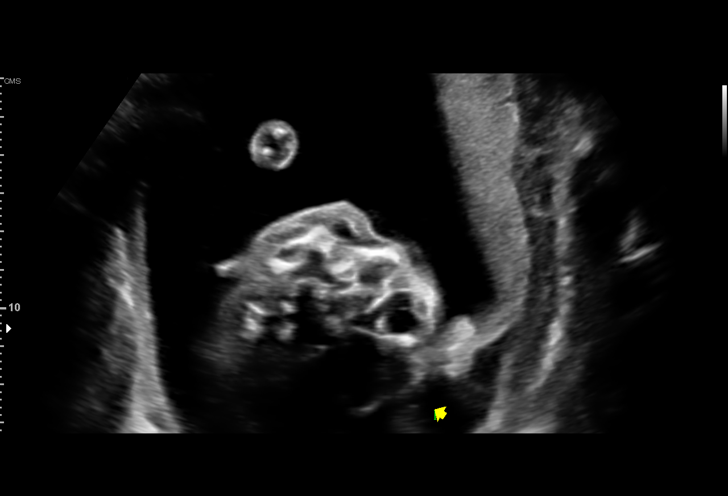
[im 64/115]
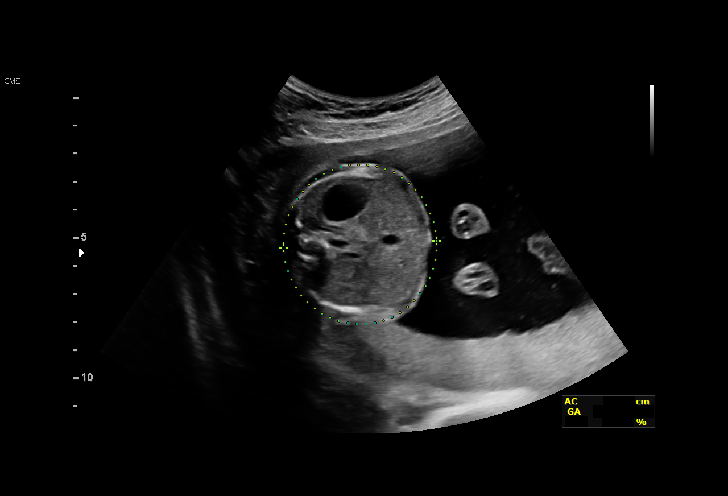
[im 72/115]
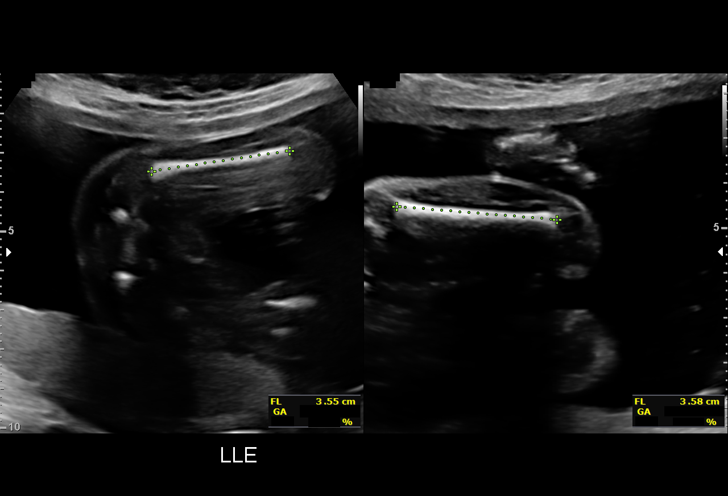
[im 81/115]
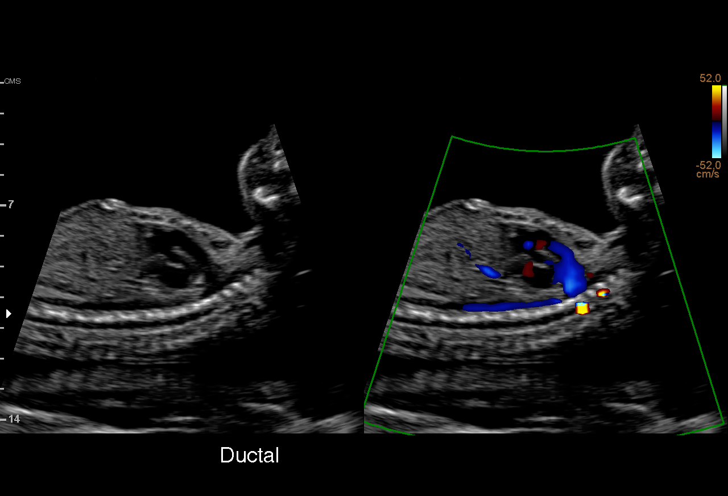
[im 93/115]
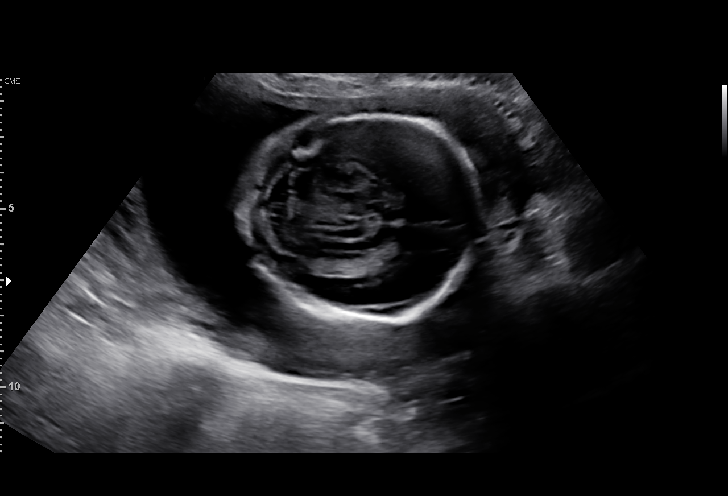
[im 102/115]
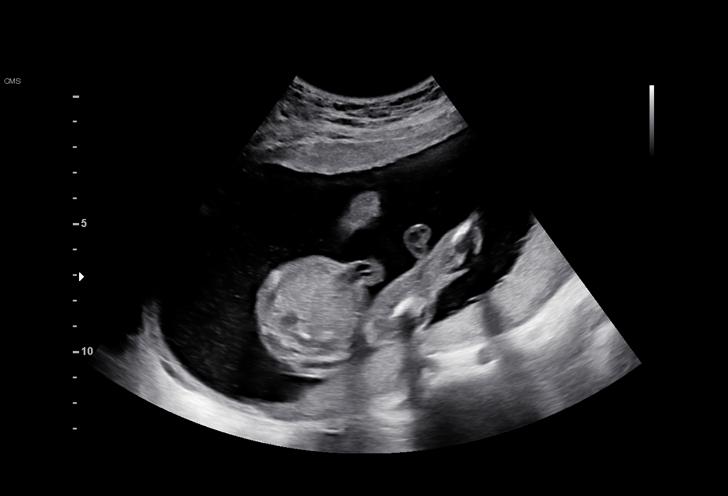
[im 110/115]
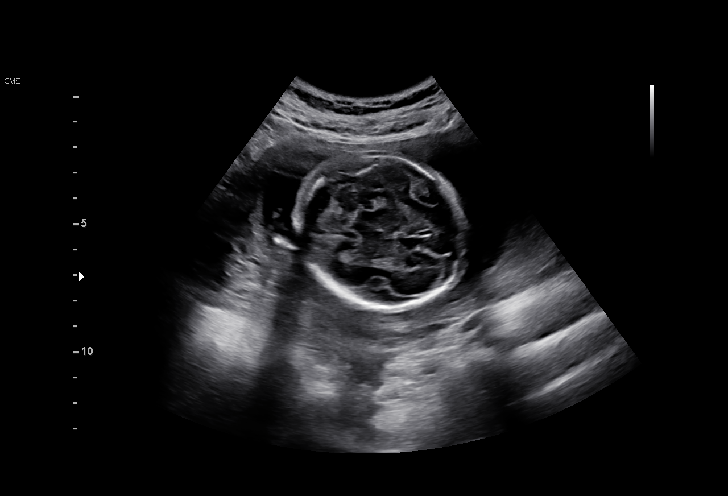

[12 of 28 positions shown; findings below may reference images not displayed]

1  US MFM OB COMP + 14 WK               76805.01     ISRIL NEUPANE
 ----------------------------------------------------------------------

 ----------------------------------------------------------------------
Indications

  Fetal abnormality - other known or suspected
  21 weeks gestation of pregnancy
  Antenatal screening for malformations
 ----------------------------------------------------------------------
Fetal Evaluation

 Num Of Fetuses:         1
 Fetal Heart Rate(bpm):  159
 Cardiac Activity:       Observed
 Presentation:           Cephalic
 Placenta:               Posterior
 P. Cord Insertion:      Visualized

 Amniotic Fluid
 AFI FV:      Within normal limits

                             Largest Pocket(cm)

Biometry

 BPD:      57.4  mm     G. Age:  23w 4d         95  %    CI:         77.3   %    70 - 86
                                                         FL/HC:      17.2   %    18.4 -
 HC:      206.7  mm     G. Age:  22w 5d         76  %    HC/AC:      1.19        1.06 -
 AC:      173.7  mm     G. Age:  22w 2d         57  %    FL/BPD:     62.0   %    71 - 87
 FL:       35.6  mm     G. Age:  21w 2d         21  %    FL/AC:      20.5   %    20 - 24
 HUM:        35  mm     G. Age:  22w 0d         53  %
 CER:      23.6  mm     G. Age:  21w 6d         48  %
 NFT:         4  mm
 CM:        4.4  mm

 Est. FW:     468  gm      1 lb 1 oz     52  %
OB History

 Gravidity:    2         Term:   1        Prem:   0        SAB:   0
 TOP:          0       Ectopic:  0        Living: 1
Gestational Age

 LMP:           21w 6d        Date:  12/08/18                 EDD:   09/14/19
 U/S Today:     22w 3d                                        EDD:   09/10/19
 Best:          21w 6d     Det. By:  LMP  (12/08/18)          EDD:   09/14/19
Anatomy

 Cranium:               Appears normal         LVOT:                   Appears normal
 Cavum:                 Appears normal         Aortic Arch:            Appears normal
 Ventricles:            Appears normal         Ductal Arch:            Appears normal
 Choroid Plexus:        Appears normal         Diaphragm:              Appears normal
 Cerebellum:            Appears normal         Stomach:                Appears normal, left
                                                                       sided
 Posterior Fossa:       Appears normal         Abdomen:                Appears normal
 Nuchal Fold:           Not applicable (>20    Abdominal Wall:         Appears nml (cord
                        wks GA)                                        insert, abd wall)
 Face:                  Appears normal         Cord Vessels:           Appears normal (3
                        (orbits and profile)                           vessel cord)
 Lips:                  Appears normal         Kidneys:                Appear normal
 Palate:                Appears normal         Bladder:                Appears normal
 Thoracic:              Appears normal         Spine:                  Appears normal
 Heart:                 Appears normal         Upper Extremities:      Appears normal
                        (4CH, axis, and
                        situs)
 RVOT:                  Appears normal         Lower Extremities:      Appears normal

 Other:  Heels and right 5th digit visualized. Nasal bone visualized.
Cervix Uterus Adnexa

 Cervix
 Length:           3.43  cm.
 Normal appearance by transabdominal scan.

 Uterus
 No abnormality visualized.

 Left Ovary
 Not visualized.

 Right Ovary
 Within normal limits.

 Cul De Sac
 No free fluid seen.

 Adnexa
 No abnormality visualized.
Comments

 This patient was seen for a detailed fetal anatomy scan. She
 denies any significant past medical history and denies any
 problems in her current pregnancy.
 She had a cell free DNA test earlier in her pregnancy which
 indicated a low risk for trisomy 21, 18, and 13.
 The patient reports that she was notified by [employer] that a
 female fetus is predicted.  However during today's ultrasound,
 a male fetus is noted.  We obtained a copy of the patient's
 cell free DNA results from her chart which stated that a male
 fetus is predicted.  The patient was advised to check the
 email she received from [employer] to make sure that she
 received the correct report.  We verified that her name and
 date of birth from the report which stated a male fetus is
 predicted was correct.
 She was informed that the fetal growth and amniotic fluid
 level were appropriate for her gestational age.
 There were no obvious fetal anomalies noted on today's
 ultrasound exam.
 The patient was informed that anomalies may be missed due
 to technical limitations. If the fetus is in a suboptimal position
 or maternal habitus is increased, visualization of the fetus in
 the maternal uterus may be impaired.
 The patient was reassured that the cell free DNA test report
 she received via email was most likely not correct.  Due to
 the discrepancy in the test results, she was offered and
 declined an amniocentesis today for definitive diagnosis of
 fetal aneuploidy and the gender.  She reports that she is
 comfortable with what we showed her today.  The patient was
 given a copy of the cell free DNA test results we printed from
 her chart indicating that a male fetus is present.
 Follow up as indicated.

## 2021-12-09 ENCOUNTER — Telehealth: Payer: Self-pay | Admitting: Podiatry

## 2021-12-09 ENCOUNTER — Telehealth: Payer: Self-pay | Admitting: *Deleted

## 2021-12-09 NOTE — Telephone Encounter (Signed)
Pt called and is needing a note for work stating she did have bunion surgery last yr. Her job is demanding she wears high heels and they said if she has documented that she had the surgery and they will let her wear wedges. Please call pt late afternoon as she works 3rd shift.

## 2021-12-10 NOTE — Telephone Encounter (Signed)
That's fine

## 2021-12-13 ENCOUNTER — Encounter: Payer: Self-pay | Admitting: Podiatry

## 2021-12-14 NOTE — Telephone Encounter (Signed)
Left message for pt letting her know the letter is ready and I just need to know if she would like it mailed to her or if she would like to pick up.

## 2021-12-31 IMAGING — US US MFM OB LIMITED
1 series · 15 of 18 positions shown · non-contrast
Comparison: none

[Series 1: us mfm ob limited · 18 acquisitions, 15 frames shown]
[im 1/18]
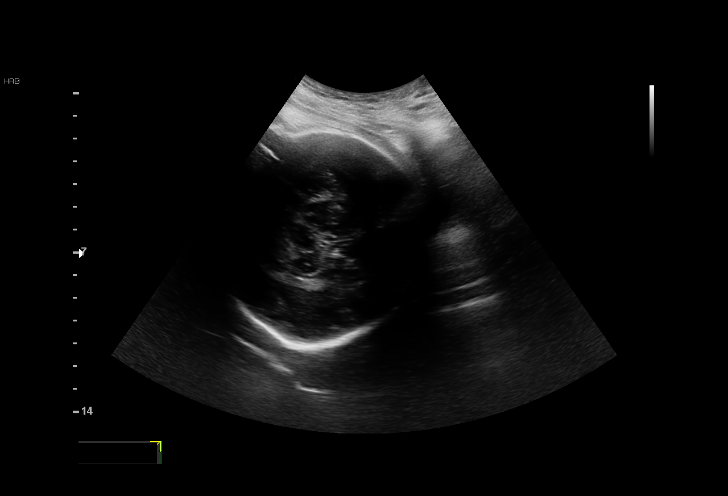
[im 2/18]
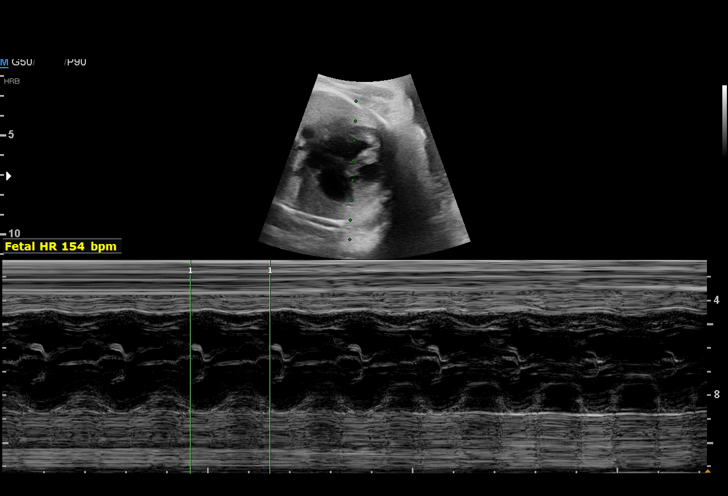
[im 4/18]
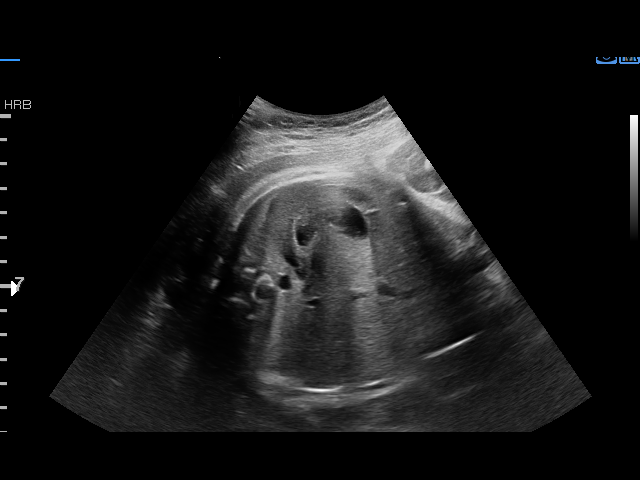
[im 5/18]
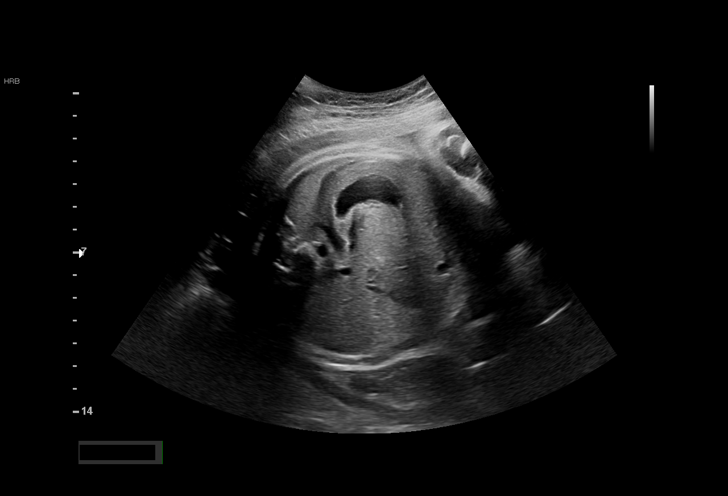
[im 6/18]
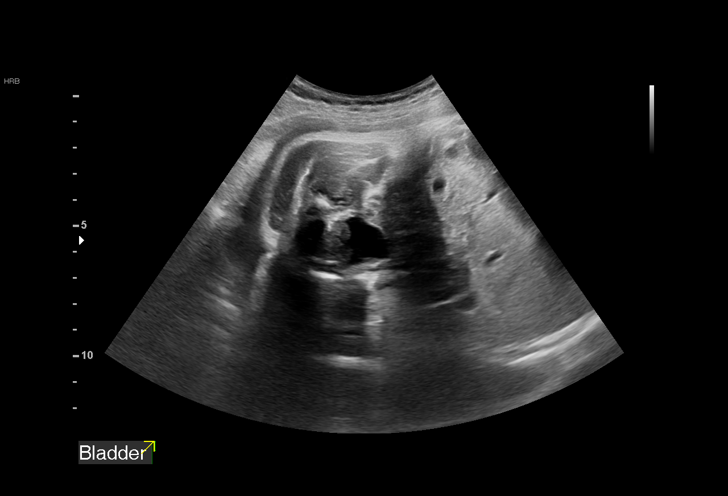
[im 7/18]
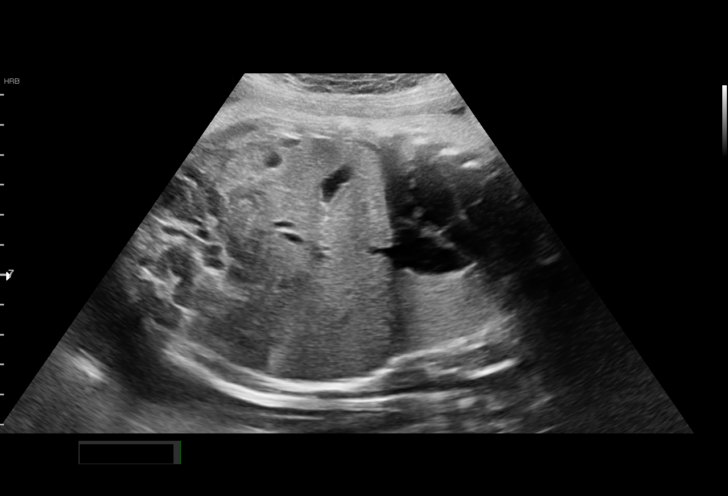
[im 8/18]
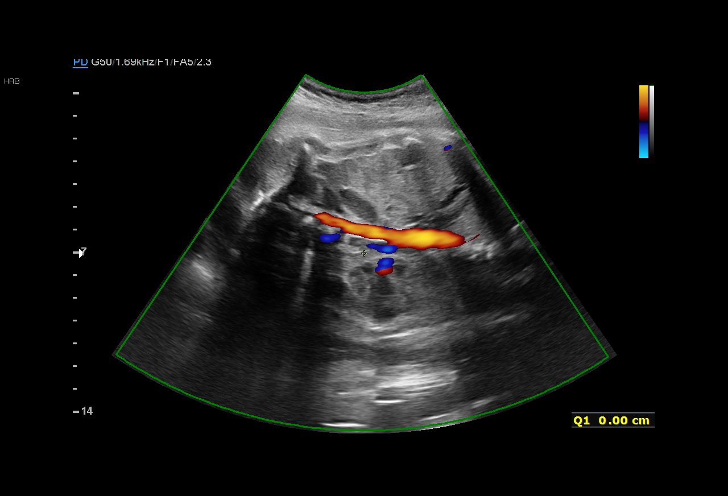
[im 10/18]
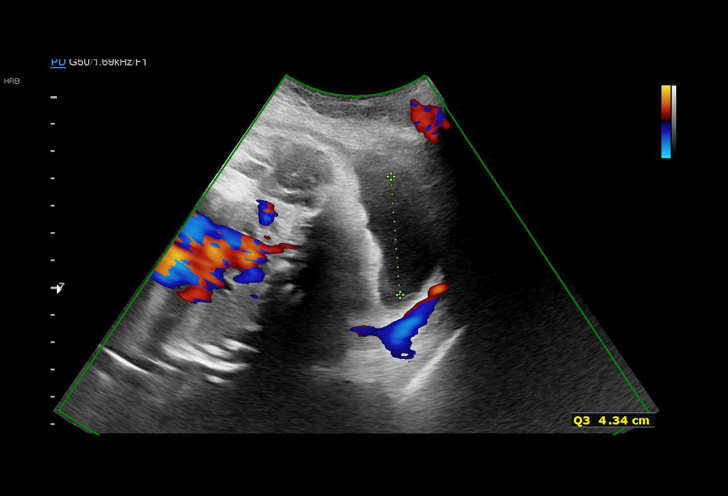
[im 11/18]
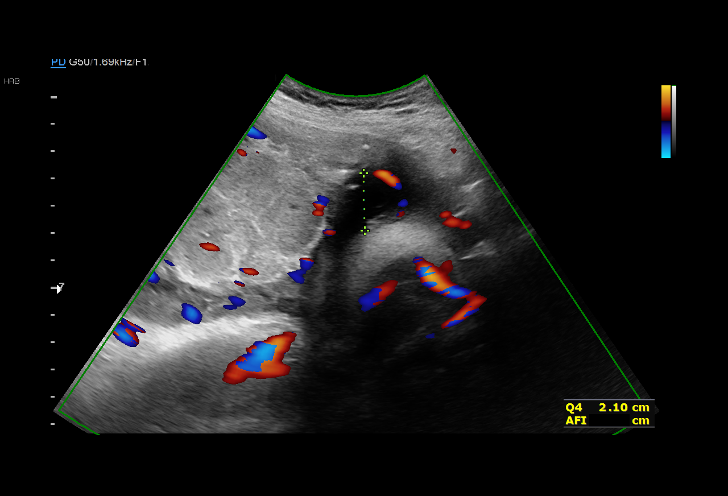
[im 12/18]
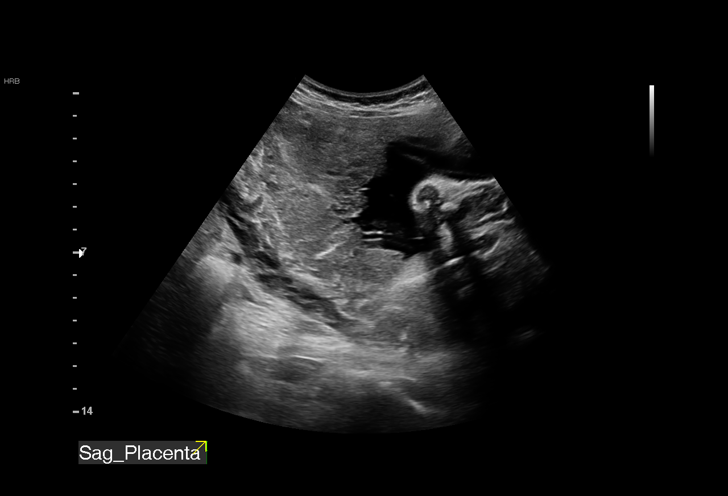
[im 13/18]
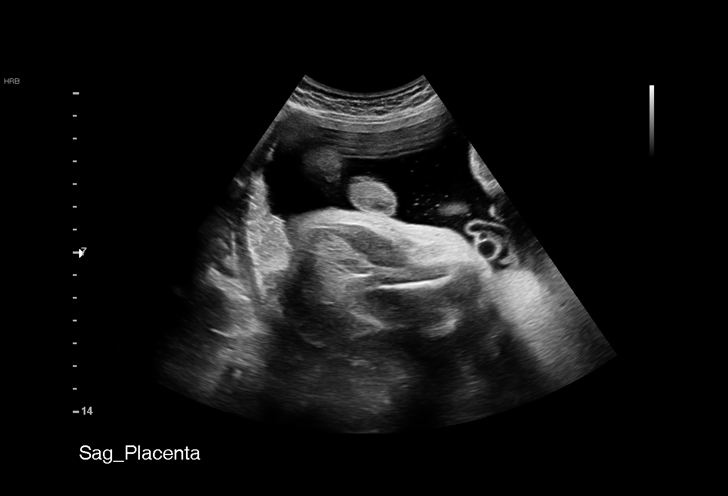
[im 14/18]
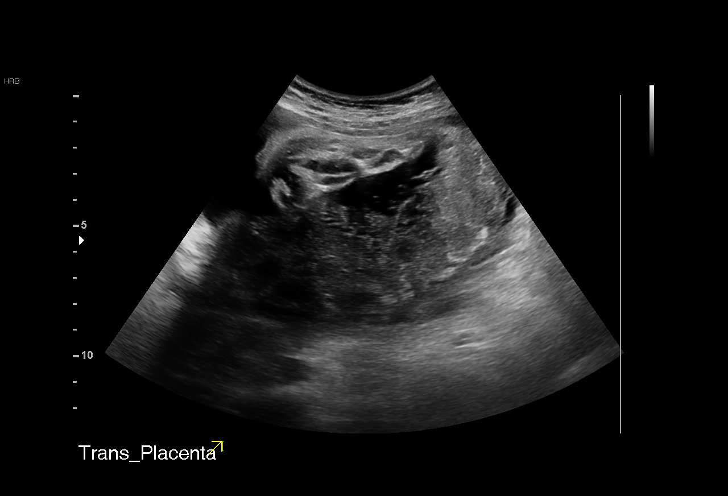
[im 16/18]
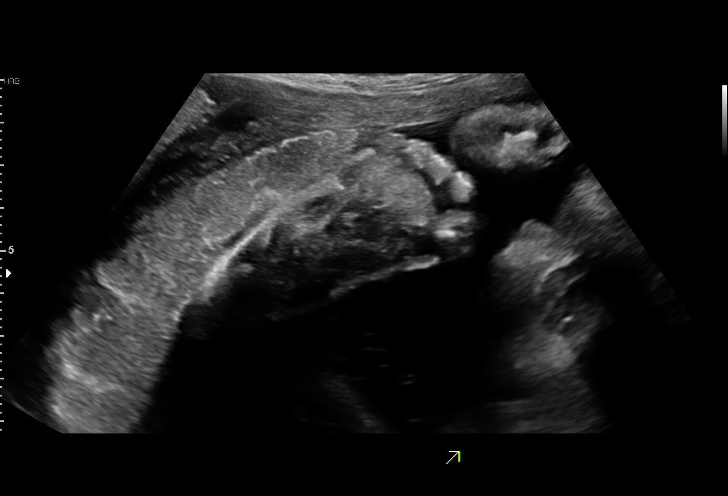
[im 17/18]
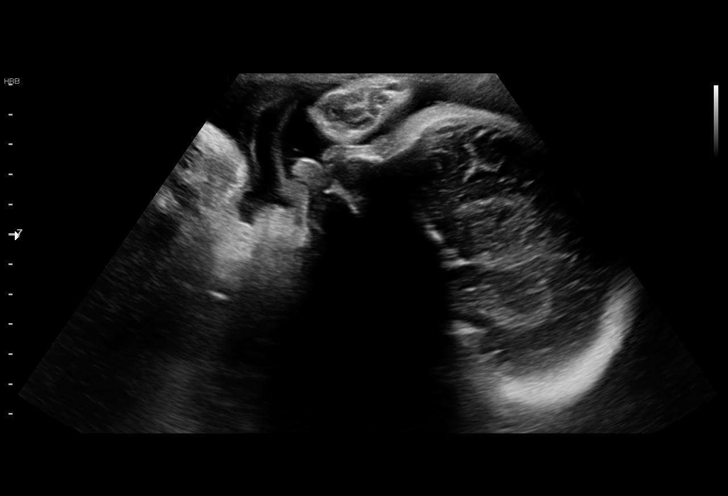
[im 18/18]
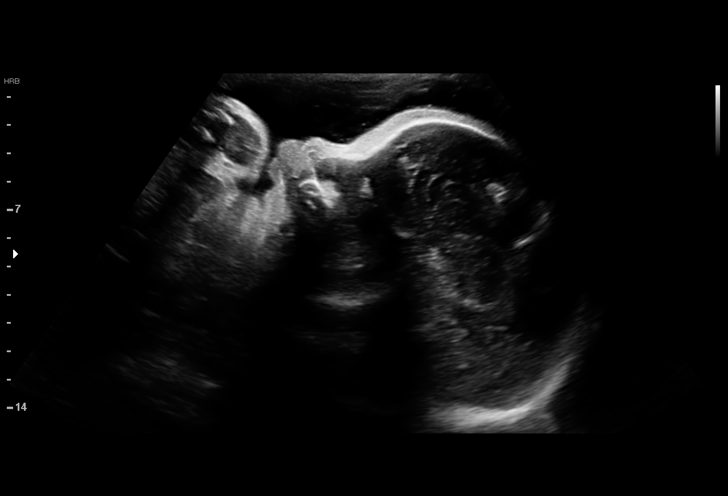

[15 of 18 positions shown; findings below may reference images not displayed]

1  US MFM OB LIMITED                    76815.01     CARMEL JOSEPH CHEVOIR
 ----------------------------------------------------------------------

 ----------------------------------------------------------------------
Indications

  Leakage of amniotic fluid (Negative
  Amnisure, Negative Fern)
  35 weeks gestation of pregnancy
 ----------------------------------------------------------------------
Fetal Evaluation

 Num Of Fetuses:         1
 Fetal Heart Rate(bpm):  154
 Cardiac Activity:       Observed
 Presentation:           Cephalic
 Placenta:               Posterior

 Amniotic Fluid
 AFI FV:      Within normal limits

 AFI Sum(cm)     %Tile       Largest Pocket(cm)
 11.14           30

 RUQ(cm)       RLQ(cm)       LUQ(cm)        LLQ(cm)
 0
OB History

 Gravidity:    2         Term:   1        Prem:   0        SAB:   0
 TOP:          0       Ectopic:  0        Living: 1
Gestational Age

 LMP:           35w 6d        Date:  12/08/18                 EDD:   09/14/19
 Best:          35w 6d     Det. By:  LMP  (12/08/18)          EDD:   09/14/19
Anatomy
 Stomach:               Appears normal         Bladder:                Appears normal
Cervix Uterus Adnexa

 Cervix
 Not visualized (advanced GA >80wks)
Comments

 A limited ultrasound performed today shows a normal AFI of
 11.4 cm.

 The fetus was in the vertex presentation.

## 2022-02-06 ENCOUNTER — Other Ambulatory Visit: Payer: Self-pay | Admitting: Obstetrics & Gynecology

## 2022-02-06 DIAGNOSIS — Z3041 Encounter for surveillance of contraceptive pills: Secondary | ICD-10-CM

## 2022-02-07 ENCOUNTER — Other Ambulatory Visit: Payer: Self-pay | Admitting: *Deleted

## 2022-02-07 NOTE — Telephone Encounter (Signed)
erroneous

## 2022-04-07 ENCOUNTER — Encounter: Payer: Self-pay | Admitting: Obstetrics & Gynecology

## 2022-04-07 ENCOUNTER — Ambulatory Visit (INDEPENDENT_AMBULATORY_CARE_PROVIDER_SITE_OTHER): Payer: BC Managed Care – PPO | Admitting: Obstetrics & Gynecology

## 2022-04-07 ENCOUNTER — Other Ambulatory Visit (HOSPITAL_COMMUNITY)
Admission: RE | Admit: 2022-04-07 | Discharge: 2022-04-07 | Disposition: A | Discharge status: Home / Self Care | Payer: BC Managed Care – PPO | Source: Ambulatory Visit | Attending: Obstetrics & Gynecology | Admitting: Obstetrics & Gynecology

## 2022-04-07 VITALS — BP 111/78 | HR 80 | Ht 60.0 in | Wt 118.4 lb

## 2022-04-07 DIAGNOSIS — Z01419 Encounter for gynecological examination (general) (routine) without abnormal findings: Secondary | ICD-10-CM

## 2022-04-07 DIAGNOSIS — Z113 Encounter for screening for infections with a predominantly sexual mode of transmission: Secondary | ICD-10-CM | POA: Insufficient documentation

## 2022-04-07 DIAGNOSIS — Z3041 Encounter for surveillance of contraceptive pills: Secondary | ICD-10-CM

## 2022-04-07 NOTE — Telephone Encounter (Signed)
error 

## 2022-04-07 NOTE — Progress Notes (Signed)
GYNECOLOGY ANNUAL PREVENTATIVE CARE ENCOUNTER NOTE  History:     Alicia Rich is a 26 y.o. [phone] female here for a routine annual gynecologic exam.  Current complaints: Nausea for a couple hours after taking her BC pills in the morning. She changed BC pills a year ago and since then she has chest pain with any alcohol intake. She says after one glass of sangria or margarita she will have burning pain that feels like she is suffocating and rates it a 8/10, with 10 being the worst. If she has two drinks it will be 10/10 pain, with 10 being the worst. When she is on her placebo week, she feels like she has energy and has no pain associated with alcoholic drinks. Denies abnormal vaginal bleeding, discharge, pelvic pain, problems with intercourse or other gynecologic concerns.    Gynecologic History Patient's last menstrual period was 03/30/2022 (approximate).  Contraception: OCP (estrogen/progesterone) Last Pap: 07/18/2018. Result was normal with negative HPV  Obstetric History OB History  Gravida Para Term Preterm AB Living  2 2 2  0 0 2  SAB IAB Ectopic Multiple Live Births  0 0 0 0 2    # Outcome Date GA Lbr Len/2nd Weight Sex Delivery Anes PTL Lv  2 Term 09/08/19 [redacted]w[redacted]d 05:19 / 00:37 7 lb 9.5 oz (3.445 kg) M Vag-Spont None  LIV     Birth Comments: VBAC  1 Term 04/14/18 [redacted]w[redacted]d  6 lb 1 oz (2.75 kg) F CS-LTranv  N LIV     Complications: Breech presentation, antepartum    Past Medical History:  Diagnosis Date   Medical history non-contributory     Past Surgical History:  Procedure Laterality Date   CESAREAN SECTION N/A 04/14/2018   Procedure: CESAREAN SECTION;  Surgeon: 06/14/2018, MD;  Location: [facility] BIRTHING SUITES;  Service: Obstetrics;  Laterality: N/A;   FOOT SURGERY Left 02/2021    Current Outpatient Medications on File Prior to Visit  Medication Sig Dispense Refill   norgestimate-ethinyl estradiol (ORTHO-CYCLEN) 0.25-35 MG-MCG tablet TAKE 1 TABLET BY MOUTH  EVERY DAY 84 tablet 1   ibuprofen (ADVIL) 200 MG tablet Take by mouth. (Patient not taking: Reported on 04/07/2022)     ondansetron (ZOFRAN) 4 MG tablet Take 1 tablet (4 mg total) by mouth every 8 (eight) hours as needed for nausea or vomiting. (Patient not taking: Reported on 04/07/2022) 20 tablet 0   oxyCODONE-acetaminophen (PERCOCET) 10-325 MG tablet Take 1 tablet by mouth every 4 (four) hours as needed for pain. (Patient not taking: Reported on 04/07/2022) 25 tablet 0   sertraline (ZOLOFT) 50 MG tablet Take 0.5 tablets (25 mg total) by mouth at bedtime. (Patient not taking: Reported on 04/07/2022) 30 tablet 2   No current facility-administered medications on file prior to visit.    No Known Allergies  Social History:  reports that she has never smoked. She has never used smokeless tobacco. She reports current alcohol use. She reports that she does not use drugs.  Family History  Problem Relation Age of Onset   Heart attack Maternal Grandfather    Heart disease Maternal Grandfather    Healthy Mother    Heart disease Paternal Grandfather    Stroke Paternal Grandfather     The following portions of the patient's history were reviewed and updated as appropriate: allergies, current medications, past family history, past medical history, past social history, past surgical history and problem list.  Review of Systems Pertinent items noted in HPI and remainder  of comprehensive ROS otherwise negative.  Physical Exam:  BP 111/78   Pulse 80   Ht 5' (1.524 m)   Wt 118 lb 6.4 oz (53.7 kg)   LMP 03/30/2022 (Approximate)   BMI 23.12 kg/m  CONSTITUTIONAL: Well-developed, well-nourished female in no acute distress.  HENT:  Normocephalic, atraumatic, External right and left ear normal.  EYES: Conjunctivae and EOM are normal. Pupils are equal, round, and reactive to light. No scleral icterus.  NECK: Normal range of motion, supple, no masses.  Normal thyroid.  SKIN: Skin is warm and dry. No rash  noted. Not diaphoretic. No erythema. No pallor. MUSCULOSKELETAL: Normal range of motion. No tenderness.  No cyanosis, clubbing, or edema. NEUROLOGIC: Alert and oriented to person, place, and time. Normal reflexes, muscle tone coordination.  PSYCHIATRIC: Normal mood and affect. Normal behavior. Normal judgment and thought content. CARDIOVASCULAR: Normal heart rate noted, regular rhythm RESPIRATORY: Clear to auscultation bilaterally. Effort and breath sounds normal, no problems with respiration noted. BREASTS: Symmetric in size. No masses, tenderness, skin changes, nipple drainage, or lymphadenopathy bilaterally. Performed in the presence of a chaperone. ABDOMEN: Soft, no distention noted.  No tenderness, rebound or guarding.  PELVIC: Normal appearing external genitalia and urethral meatus; normal appearing vaginal mucosa and cervix.  No abnormal vaginal discharge noted.  Pap smear obtained.  Normal uterine size, no other palpable masses, no uterine or adnexal tenderness.  Performed in the presence of a chaperone.   Assessment and Plan:    1. Encounter for surveillance of contraceptive pills Pt only wants OCPs, wants to try another formulation. She was told this was an unusual side effect and likely not due to OCPs.  However, will try another formulation.  She was given a 2 month supply of Slynd, will monitor effects.  She was told to consider the Nuvaring, patch or IUD if symptoms continue.   2. Routine screening for STI (sexually transmitted infection) - Cytology - PAP( [facility]) - RPR+HBsAg+HCVAb+HIV Safe sex practices recommended.  3. Well woman exam with routine gynecological exam - Cytology - PAP( [place]) Will follow up results of pap smear and manage accordingly. Routine preventative health maintenance measures emphasized. Please refer to After Visit Summary for other counseling recommendations.     [name], Medical Student 04/07/2022, 03:45 PM    Attestation of  Attending Supervision of Student:  I confirm that I have verified the information documented in the medical student's note and that I have also personally reperformed the history, physical exam and all medical decision making activities.  I have verified that all services and findings are accurately documented in this student's note; and I agree with management and plan as outlined in the documentation. I have also made any necessary editorial changes.   Verita Schneiders, MD, [facility] Attending [place], [facility] for [employer], [place]

## 2022-04-07 NOTE — Progress Notes (Signed)
Pt presents for AEX. Pt states she has been having nausea and chest pain when drinking alcohol and she feels he birth control pills are causing this problem. Pt request birth control consult.   Pt requesting STD testing.

## 2022-04-12 LAB — CYTOLOGY - PAP
Chlamydia: NEGATIVE
Comment: NEGATIVE
Comment: NEGATIVE
Comment: NORMAL
Diagnosis: NEGATIVE
Neisseria Gonorrhea: NEGATIVE
Trichomonas: NEGATIVE

## 2022-05-03 ENCOUNTER — Telehealth: Payer: Self-pay | Admitting: *Deleted

## 2022-05-03 ENCOUNTER — Encounter: Payer: Self-pay | Admitting: Obstetrics & Gynecology

## 2022-05-03 MED ORDER — XULANE 150-35 MCG/24HR TD PTWK: 1.0000 | MEDICATED_PATCH | TRANSDERMAL | 12 refills | Status: DC

## 2022-05-03 NOTE — Telephone Encounter (Signed)
Returned pt's call for request for birth control patch, okay per Dr A to send in rx.

## 2022-05-04 ENCOUNTER — Other Ambulatory Visit: Payer: Self-pay | Admitting: *Deleted

## 2022-05-04 MED ORDER — SLYND 4 MG PO TABS: 4.0000 mg | ORAL_TABLET | Freq: Every day | ORAL | 11 refills | Status: DC

## 2022-06-03 ENCOUNTER — Encounter: Payer: Self-pay | Admitting: Obstetrics & Gynecology

## 2022-09-11 ENCOUNTER — Ambulatory Visit (HOSPITAL_COMMUNITY)
Admission: EM | Admit: 2022-09-11 | Discharge: 2022-09-11 | Disposition: A | Discharge status: Home / Self Care | Payer: BC Managed Care – PPO | Attending: Emergency Medicine | Admitting: Emergency Medicine

## 2022-09-11 ENCOUNTER — Encounter (HOSPITAL_COMMUNITY): Payer: Self-pay

## 2022-09-11 DIAGNOSIS — J069 Acute upper respiratory infection, unspecified: Secondary | ICD-10-CM | POA: Diagnosis not present

## 2022-09-11 MED ORDER — PROMETHAZINE-DM 6.25-15 MG/5ML PO SYRP: 5.0000 mL | ORAL_SOLUTION | Freq: Three times a day (TID) | ORAL | 0 refills | Status: DC | PRN

## 2022-09-11 MED ORDER — ACETAMINOPHEN 325 MG PO TABS
ORAL_TABLET | ORAL | Status: AC
  Filled 2022-09-11: qty 2

## 2022-09-11 MED ORDER — ACETAMINOPHEN 325 MG PO TABS
650.0000 mg | ORAL_TABLET | Freq: Once | ORAL | Status: AC
  Administered 2022-09-11: 650 mg via ORAL

## 2022-09-11 MED ORDER — BENZONATATE 100 MG PO CAPS: 100.0000 mg | ORAL_CAPSULE | Freq: Three times a day (TID) | ORAL | 0 refills | Status: DC

## 2022-09-11 NOTE — Discharge Instructions (Signed)
Overall your symptoms are consistent with a viral upper respiratory infection.  Unfortunately you are out of the time range to administer Tamiflu.  I suspected that you have the flu.  Please alternate between Tylenol and ibuprofen every 4-6 hours as needed for fever and bodyaches.  You can take the [facility] as needed for cough, you can take the cough syrup as needed at night.  Do not drink or drive on this medication, as it will make you drowsy.  Please make sure you are drinking at least 64 ounces of water a day, and getting lots of rest.  If you develop a sore throat, you can do saline gargles and sleep with a humidifier.  Mucinex can help with your congestion, you can take 1200 mg twice daily.  Please return to clinic if you have no improvement over the next week, or your symptoms worsen.

## 2022-09-11 NOTE — ED Provider Notes (Signed)
[place]    CSN: [id number] Arrival date & time: 09/11/22  1735      History   Chief Complaint Chief Complaint  Patient presents with   Cough    HPI Alicia Rich is a 27 y.o. female.   Patient reports 3 days of cough, congestion, sore throat, fever and bodyaches.  Reports recent exposure to the flu.  Is concerned because one of her coworkers has been hospitalized with pneumonia.  Last took Tylenol earlier this morning. Denies abdominal pain, N/V/D, SOB or chest pain.   Patient does not smoke, no history of asthma or respiratory conditions.   The history is provided by the patient.  Cough Associated symptoms: chills, fever and sore throat   Associated symptoms: no chest pain, no ear pain, no rash and no shortness of breath     Past Medical History:  Diagnosis Date   Medical history non-contributory     Patient Active Problem List   Diagnosis Date Noted   Depression, major, single episode, mild ([place]) 08/17/2020    Past Surgical History:  Procedure Laterality Date   CESAREAN SECTION N/A 04/14/2018   Procedure: CESAREAN SECTION;  Surgeon: Woodroe Mode, MD;  Location: [facility];  Service: Obstetrics;  Laterality: N/A;   FOOT SURGERY Left 02/2021    OB History     Gravida  2   Para  2   Term  2   Preterm  0   AB  0   Living  2      SAB  0   IAB  0   Ectopic  0   Multiple  0   Live Births  2            Home Medications    Prior to Admission medications   Medication Sig Start Date End Date Taking? Authorizing Provider  benzonatate (TESSALON) 100 MG capsule Take 1 capsule (100 mg total) by mouth every 8 (eight) hours. 09/11/22  Yes Louretta Shorten, [place] N, FNP  norgestimate-ethinyl estradiol (ORTHO-CYCLEN) 0.25-35 MG-MCG tablet TAKE 1 TABLET BY MOUTH EVERY DAY 02/07/22  Yes Anyanwu, Sallyanne Havers, MD  promethazine-dextromethorphan (PROMETHAZINE-DM) 6.25-15 MG/5ML syrup Take 5 mLs by mouth 3 (three) times daily as  needed for cough. 09/11/22  Yes Monterrio Gerst, [place] N, FNP    Family History Family History  Problem Relation Age of Onset   Heart attack Maternal Grandfather    Heart disease Maternal Grandfather    Healthy Mother    Heart disease Paternal Grandfather    Stroke Paternal Grandfather     Social History Social History   Tobacco Use   Smoking status: Never   Smokeless tobacco: Never  Vaping Use   Vaping Use: Never used  Substance Use Topics   Alcohol use: Yes    Comment: socailly   Drug use: No     Allergies   Patient has no known allergies.   Review of Systems Review of Systems  Constitutional:  Positive for chills and fever.  HENT:  Positive for sore throat. Negative for ear pain.   Eyes:  Negative for pain and visual disturbance.  Respiratory:  Positive for cough. Negative for shortness of breath.   Cardiovascular:  Negative for chest pain and palpitations.  Gastrointestinal:  Negative for abdominal pain and vomiting.  Genitourinary:  Negative for dysuria and hematuria.  Musculoskeletal:  Negative for arthralgias and back pain.  Skin:  Negative for color change and rash.  Neurological:  Negative for seizures and  syncope.  All other systems reviewed and are negative.    Physical Exam Triage Vital Signs ED Triage Vitals  Enc Vitals Group     BP 09/11/22 1807 116/67     Pulse Rate 09/11/22 1807 (!) 135     Resp 09/11/22 1807 16     Temp 09/11/22 1807 (!) 100.7 F (38.2 C)     Temp Source 09/11/22 1807 Oral     SpO2 09/11/22 1807 98 %     Weight 09/11/22 1807 120 lb (54.4 kg)     Height 09/11/22 1807 5' (1.524 m)     Head Circumference --      Peak Flow --      Pain Score 09/11/22 1805 10     Pain Loc --      Pain Edu? --      Excl. in [place]? --    No data found.  Updated Vital Signs BP 116/67 (BP Location: Right Arm)   Pulse (!) 135   Temp (!) 100.7 F (38.2 C) (Oral)   Resp 16   Ht 5' (1.524 m)   Wt 120 lb (54.4 kg)   LMP 08/11/2022 (Approximate)    SpO2 98%   Breastfeeding No   BMI 23.44 kg/m   Visual Acuity Right Eye Distance:   Left Eye Distance:   Bilateral Distance:    Right Eye Near:   Left Eye Near:    Bilateral Near:     Physical Exam Vitals and nursing note reviewed.  Constitutional:      General: She is not in acute distress.    Appearance: Normal appearance. She is well-developed.  HENT:     Head: Normocephalic and atraumatic.     Right Ear: External ear normal.     Left Ear: External ear normal.     Nose: Rhinorrhea present.     Mouth/Throat:     Mouth: Mucous membranes are moist.  Eyes:     Conjunctiva/sclera: Conjunctivae normal.     Pupils: Pupils are equal, round, and reactive to light.  Cardiovascular:     Rate and Rhythm: Normal rate and regular rhythm.     Heart sounds: Normal heart sounds. No murmur heard. Pulmonary:     Effort: Pulmonary effort is normal. No respiratory distress.     Breath sounds: Normal breath sounds.  Musculoskeletal:        General: No swelling. Normal range of motion.     Cervical back: Normal range of motion and neck supple.  Lymphadenopathy:     Cervical: Cervical adenopathy present.  Skin:    General: Skin is warm and dry.     Capillary Refill: Capillary refill takes less than 2 seconds.  Neurological:     Mental Status: She is alert.  Psychiatric:        Mood and Affect: Mood normal.      UC Treatments / Results  Labs (all labs ordered are listed, but only abnormal results are displayed) Labs Reviewed - No data to display  EKG   Radiology No results found.  Procedures Procedures (including critical care time)  Medications Ordered in UC Medications  acetaminophen (TYLENOL) tablet 650 mg (650 mg Oral Given 09/11/22 1819)    Initial Impression / Assessment and Plan / UC Course  I have reviewed the triage vital signs and the nursing notes.  Pertinent labs & imaging results that were available during my care of the patient were reviewed by me and  considered in my medical  decision making (see chart for details).  Vitals in triage note reviewed, patient is hemodynamically stable.  Suspect tachycardia due to fever.  Given Tylenol in clinic.  Lungs vesicular.  Due to recent flu exposure, suspect viral illness, out of the window for Tamiflu administration.  Discussed symptomatic treatment and return precautions, patient verbalized understanding.  No questions at this time.    Final Clinical Impressions(s) / UC Diagnoses   Final diagnoses:  Viral URI with cough     Discharge Instructions      Overall your symptoms are consistent with a viral upper respiratory infection.  Unfortunately you are out of the time range to administer Tamiflu.  I suspected that you have the flu.  Please alternate between Tylenol and ibuprofen every 4-6 hours as needed for fever and bodyaches.  You can take the [facility] as needed for cough, you can take the cough syrup as needed at night.  Do not drink or drive on this medication, as it will make you drowsy.  Please make sure you are drinking at least 64 ounces of water a day, and getting lots of rest.  If you develop a sore throat, you can do saline gargles and sleep with a humidifier.  Mucinex can help with your congestion, you can take 1200 mg twice daily.  Please return to clinic if you have no improvement over the next week, or your symptoms worsen.     ED Prescriptions     Medication Sig Dispense Auth. Provider   promethazine-dextromethorphan (PROMETHAZINE-DM) 6.25-15 MG/5ML syrup Take 5 mLs by mouth 3 (three) times daily as needed for cough. 118 mL Louretta Shorten, [place], FNP   benzonatate (TESSALON) 100 MG capsule Take 1 capsule (100 mg total) by mouth every 8 (eight) hours. 21 capsule Dontario Evetts, [place] N, Ernest      I have reviewed the PDMP during this encounter.   Nicolas Banh, [place] N, [place] 09/11/22 [phone]

## 2022-09-11 NOTE — ED Triage Notes (Signed)
Fever, cough, congestion, body aches, sore throat onset 3 days. Coworkers have flu and some have pneumonia. Taken tylenol and Nyquil with little relief.

## 2022-11-07 ENCOUNTER — Inpatient Hospital Stay (HOSPITAL_COMMUNITY)
Admission: AD | Admit: 2022-11-07 | Discharge: 2022-11-08 | Disposition: A | Discharge status: Home / Self Care | Payer: BC Managed Care – PPO | Attending: Family Medicine | Admitting: Family Medicine

## 2022-11-07 DIAGNOSIS — O036 Delayed or excessive hemorrhage following complete or unspecified spontaneous abortion: Secondary | ICD-10-CM | POA: Insufficient documentation

## 2022-11-07 DIAGNOSIS — N939 Abnormal uterine and vaginal bleeding, unspecified: Secondary | ICD-10-CM

## 2022-11-07 DIAGNOSIS — Z3202 Encounter for pregnancy test, result negative: Secondary | ICD-10-CM

## 2022-11-08 DIAGNOSIS — N939 Abnormal uterine and vaginal bleeding, unspecified: Secondary | ICD-10-CM | POA: Diagnosis not present

## 2022-11-08 DIAGNOSIS — O036 Delayed or excessive hemorrhage following complete or unspecified spontaneous abortion: Secondary | ICD-10-CM | POA: Diagnosis not present

## 2022-11-08 DIAGNOSIS — Z3202 Encounter for pregnancy test, result negative: Secondary | ICD-10-CM | POA: Diagnosis not present

## 2022-11-08 LAB — URINALYSIS, ROUTINE W REFLEX MICROSCOPIC
Bacteria, UA: NONE SEEN
Bilirubin Urine: NEGATIVE
Glucose, UA: NEGATIVE mg/dL
Ketones, ur: NEGATIVE mg/dL
Leukocytes,Ua: NEGATIVE
Nitrite: NEGATIVE
Protein, ur: NEGATIVE mg/dL
Specific Gravity, Urine: 1.027 (ref 1.005–1.030)
pH: 5 (ref 5.0–8.0)

## 2022-11-08 LAB — POCT PREGNANCY, URINE: Preg Test, Ur: POSITIVE — AB

## 2022-11-08 LAB — WET PREP, GENITAL
Clue Cells Wet Prep HPF POC: NONE SEEN
Sperm: NONE SEEN
Trich, Wet Prep: NONE SEEN
WBC, Wet Prep HPF POC: >=10 — AB (ref <10)
Yeast Wet Prep HPF POC: NONE SEEN

## 2022-11-08 LAB — GC/CHLAMYDIA PROBE AMP (~~LOC~~) NOT AT ARMC
Chlamydia: NEGATIVE
Comment: NEGATIVE
Comment: NORMAL
Neisseria Gonorrhea: NEGATIVE

## 2022-11-08 NOTE — MAU Provider Note (Signed)
Event Date/Time   First Provider Initiated Contact with Patient 11/08/22 0040      S Alicia Rich is a 27 y.o. [id number] patient who presents to MAU today with complaint of vaginal spotting. Patient reports at 2200 she had one episode of spotting in her underwear. She states she put on a pad and has not had any more bleeding. She reports her last intercourse was yesterday. She denies any pain. She reports she takes OCPs daily and has not missed any pills.    O BP 109/67 (BP Location: Right Arm)   Pulse 82   Temp 97.9 F (36.6 C) (Oral)   Resp 12   Ht 5' (1.524 m)   Wt 59.4 kg   BMI 25.58 kg/m  Physical Exam Vitals and nursing note reviewed.  Constitutional:      General: She is not in acute distress.    Appearance: She is well-developed.  HENT:     Head: Normocephalic.  Eyes:     Pupils: Pupils are equal, round, and reactive to light.  Cardiovascular:     Rate and Rhythm: Normal rate and regular rhythm.     Heart sounds: Normal heart sounds.  Pulmonary:     Effort: Pulmonary effort is normal. No respiratory distress.     Breath sounds: Normal breath sounds.  Abdominal:     General: Bowel sounds are normal. There is no distension.     Palpations: Abdomen is soft.     Tenderness: There is no abdominal tenderness.  Skin:    General: Skin is warm and dry.  Neurological:     Mental Status: She is alert and oriented to person, place, and time.  Psychiatric:        Mood and Affect: Mood normal.        Behavior: Behavior normal.        Thought Content: Thought content normal.        Judgment: Judgment normal.    Results for orders placed or performed during the hospital encounter of 11/07/22 (from the past 24 hour(s))  Wet prep, genital     Status: Abnormal   Collection Time: 11/08/22 12:30 AM  Result Value Ref Range   Yeast Wet Prep HPF POC NONE SEEN NONE SEEN   Trich, Wet Prep NONE SEEN NONE SEEN   Clue Cells Wet Prep HPF POC NONE SEEN NONE SEEN   WBC,  Wet Prep HPF POC >=10 (A) <10   Sperm NONE SEEN   Pregnancy, urine POC     Status: Abnormal   Collection Time: 11/08/22 12:39 AM  Result Value Ref Range   Preg Test, Ur POSITIVE (A) NEGATIVE  Urinalysis, Routine w reflex microscopic -Urine, Clean Catch     Status: Abnormal   Collection Time: 11/08/22 12:48 AM  Result Value Ref Range   Color, Urine YELLOW YELLOW   APPearance CLEAR CLEAR   Specific Gravity, Urine 1.027 1.005 - 1.030   pH 5.0 5.0 - 8.0   Glucose, UA NEGATIVE NEGATIVE mg/dL   Hgb urine dipstick MODERATE (A) NEGATIVE   Bilirubin Urine NEGATIVE NEGATIVE   Ketones, ur NEGATIVE NEGATIVE mg/dL   Protein, ur NEGATIVE NEGATIVE mg/dL   Nitrite NEGATIVE NEGATIVE   Leukocytes,Ua NEGATIVE NEGATIVE   RBC / HPF 0-5 0 - 5 RBC/hpf   WBC, UA 0-5 0 - 5 WBC/hpf   Bacteria, UA NONE SEEN NONE SEEN   Squamous Epithelial / HPF 0-5 0 - 5 /HPF   Mucus PRESENT  A Medical screening exam complete 1. Vaginal spotting   2. Negative pregnancy test    UA Wet prep and gc/chlamydia  Reassurance provided of occasional spotting between periods. Dicussed recent intercourse as likely cause and follow up in office if persistent.   P Discharge from MAU in stable condition Patient given the option of transfer to [facility] for further evaluation or seek care in outpatient facility of choice  List of options for follow-up given  Warning signs for worsening condition that would warrant emergency follow-up discussed Patient may return to MAU as needed   Rolm Bookbinder, CNM 11/08/2022 12:40 AM

## 2022-11-08 NOTE — MAU Note (Signed)
Pt says LMP 10-22-2022 Noticed red VB- on underwear- started 10pm- while at work. Pad in Triage- none  Feels cramping - since Thursday  Last sex- yesterday  BC pills- not missed any  GYN- [facility]  Thursday- HPT- neg

## 2022-11-17 ENCOUNTER — Ambulatory Visit (INDEPENDENT_AMBULATORY_CARE_PROVIDER_SITE_OTHER): Payer: BC Managed Care – PPO | Admitting: Advanced Practice Midwife

## 2022-11-17 ENCOUNTER — Encounter: Payer: Self-pay | Admitting: Advanced Practice Midwife

## 2022-11-17 VITALS — BP 104/68 | HR 81 | Wt 132.0 lb

## 2022-11-17 DIAGNOSIS — N939 Abnormal uterine and vaginal bleeding, unspecified: Secondary | ICD-10-CM

## 2022-11-17 DIAGNOSIS — Z3041 Encounter for surveillance of contraceptive pills: Secondary | ICD-10-CM | POA: Diagnosis not present

## 2022-11-17 LAB — POCT URINE PREGNANCY: Preg Test, Ur: NEGATIVE

## 2022-11-17 NOTE — Progress Notes (Signed)
CC: Monday was having bleeding  Stopped and continue to return   Yesterday was the only day she has not had any bleeding but is having cramping   Still taking her OCP

## 2022-11-19 NOTE — Progress Notes (Signed)
GYNECOLOGY PROBLEM CARE ENCOUNTER NOTE  History:     Bernelle Mehring is a 27 y.o. [id number] female here for gynecologic problem exam.  Current complaints: vaginal spotting, no concern for pregnancy.  Patient is here to follow up on an MAU visit  04/29-04/30 (overnight) for vaginal spotting. She uses OCPs for contraception and reports 100% compliance, never missing a pill. She states her vaginal spotting started 11/07/2022 x 1 episode. She states her spotting restarted following her MAU visit but she is not currently seeing any bleeding. She estimates that she was changing her pad every five hours, pad is not full and is not saturated when she changes it. She is not experiencing symptoms associated with heavy bleeding like dizziness, weakness, or activity intolerance. Denies abnormal vaginal discharge, pelvic pain, problems with intercourse or other gynecologic concerns.    Gynecologic History No LMP recorded. (Menstrual status: Oral contraceptives). Contraception: OCP (estrogen/progesterone) Last Pap: 03/2022. Result was normal with negative HPV   Obstetric History OB History  Gravida Para Term Preterm AB Living  2 2 2  0 0 2  SAB IAB Ectopic Multiple Live Births  0 0 0 0 2    # Outcome Date GA Lbr Len/2nd Weight Sex Delivery Anes PTL Lv  2 Term 09/08/19 [redacted]w[redacted]d 05:19 / 00:37 7 lb 9.5 oz (3.445 kg) M Vag-Spont None  LIV     Birth Comments: VBAC  1 Term 04/14/18 [redacted]w[redacted]d  6 lb 1 oz (2.75 kg) F CS-LTranv  N LIV     Complications: Breech presentation, antepartum    Past Medical History:  Diagnosis Date   Medical history non-contributory     Past Surgical History:  Procedure Laterality Date   CESAREAN SECTION N/A 04/14/2018   Procedure: CESAREAN SECTION;  Surgeon: Adam Phenix, MD;  Location: [facility] B[facility]TH[facility] SU[facility];  Service: Obstetrics;  Laterality: N/A;   FOOT SURGERY Left 02/2021    Current Outpatient Medications on File Prior to Visit  Medication Sig Dispense Refill    norgestimate-ethinyl estradiol (ORTHO-CYCLEN) 0.25-35 MG-MCG tablet TAKE 1 TABLET BY MOUTH EVERY DAY 84 tablet 1   benzonatate (TESSALON) 100 MG capsule Take 1 capsule (100 mg total) by mouth every 8 (eight) hours. (Patient not taking: Reported on 11/17/2022) 21 capsule 0   promethazine-dextromethorphan (PROMETHAZINE-DM) 6.25-15 MG/5ML syrup Take 5 mLs by mouth 3 (three) times daily as needed for cough. (Patient not taking: Reported on 11/17/2022) 118 mL 0   No current facility-administered medications on file prior to visit.    No Known Allergies  Social History:  reports that she has never smoked. She has never used smokeless tobacco. She reports current alcohol use. She reports that she does not use drugs.  Family History  Problem Relation Age of Onset   Heart attack Maternal Grandfather    Heart disease Maternal Grandfather    Healthy Mother    Heart disease Paternal Grandfather    Stroke Paternal Grandfather     The following portions of the patient's history were reviewed and updated as appropriate: allergies, current medications, past family history, past medical history, past social history, past surgical history and problem list.  Review of Systems Pertinent items noted in HPI and remainder of comprehensive ROS otherwise negative.  Physical Exam:  BP 104/68   Pulse 81   Wt 132 lb (59.9 kg)   BMI 25.78 kg/m  CONSTITUTIONAL: Well-developed, well-nourished female in no acute distress.  HENT:  Normocephalic, atraumatic, External right and left ear normal.  EYES: Conjunctivae  and EOM are normal. Pupils are equal, round, and reactive to light. No scleral icterus.  SKIN: Skin is warm and dry. No rash noted. Not diaphoretic. No erythema. No pallor. MUSCULOSKELETAL: Normal range of motion. No tenderness.  No cyanosis, clubbing, or edema. NEUROLOGIC: Alert and oriented to person, place, and time. Normal reflexes, muscle tone coordination.  PSYCHIATRIC: Normal mood and affect. Normal  behavior. Normal judgment and thought content. CARDIOVASCULAR: Normal heart rate noted RESPIRATORY: Normal work of breathing   Assessment and Plan:    1. Vaginal spotting - S/p normal pelvic exam, normal vaginal swabs in MAU - Patient declines pelvic exam today - Discussed spectrum of interventions ranging from surveillance (period expected 05/20) to repeat physical exam and changing OCP to TVUS - Patient elects surveillance, will contact office as needed - POCT urine pregnancy  2. Uses oral contraception - Overall satisfied with method, started 01/2022, no history of spotting or negative side effects   Routine preventative health maintenance measures emphasized. Please refer to After Visit Summary for other counseling recommendations.     Clayton Bibles, MSA, MSN, CNM Certified Nurse Midwife, Biochemist, clinical for [employer], [facility]

## 2023-04-27 ENCOUNTER — Ambulatory Visit: Payer: BC Managed Care – PPO | Admitting: Nurse Practitioner

## 2023-04-27 ENCOUNTER — Encounter: Payer: Self-pay | Admitting: Nurse Practitioner

## 2023-04-27 VITALS — BP 112/80 | HR 78 | Temp 98.3°F | Ht 59.05 in | Wt 137.8 lb

## 2023-04-27 DIAGNOSIS — R509 Fever, unspecified: Secondary | ICD-10-CM | POA: Insufficient documentation

## 2023-04-27 DIAGNOSIS — H9202 Otalgia, left ear: Secondary | ICD-10-CM | POA: Insufficient documentation

## 2023-04-27 DIAGNOSIS — Z23 Encounter for immunization: Secondary | ICD-10-CM | POA: Diagnosis not present

## 2023-04-27 DIAGNOSIS — J0110 Acute frontal sinusitis, unspecified: Secondary | ICD-10-CM | POA: Diagnosis not present

## 2023-04-27 DIAGNOSIS — R051 Acute cough: Secondary | ICD-10-CM | POA: Diagnosis not present

## 2023-04-27 DIAGNOSIS — F320 Major depressive disorder, single episode, mild: Secondary | ICD-10-CM

## 2023-04-27 LAB — POC COVID19 BINAXNOW: SARS Coronavirus 2 Ag: NEGATIVE

## 2023-04-27 MED ORDER — FLUTICASONE PROPIONATE 50 MCG/ACT NA SUSP
2.0000 | Freq: Every day | NASAL | 0 refills | Status: DC
Start: 2023-04-27 — End: 2023-05-19

## 2023-04-27 MED ORDER — AMOXICILLIN-POT CLAVULANATE 875-125 MG PO TABS
1.0000 | ORAL_TABLET | Freq: Two times a day (BID) | ORAL | 0 refills | Status: AC
Start: 2023-04-27 — End: 2023-05-04

## 2023-04-27 NOTE — Patient Instructions (Signed)
Nice to see you today I have sent in an antibiotic and nasal spray Follow up in 6 months for your physical and labs Covid test was negative

## 2023-04-27 NOTE — Progress Notes (Signed)
New Patient Office Visit  Subjective    Patient ID: Alicia Rich, female    DOB: 1995/10/02  Age: 27 y.o. MRN: [id number]  CC:  Chief Complaint  Patient presents with   Establish Care    Pt complains of cough and congestion. Pt states cough is gone but has sinus infection. Pt states her kids are sick. Both kids neg for covid and strep. Pt complains of fever for a couple of days.     HPI Alicia Rich presents to establish care  Sick sympotms: states that her two kids were sick and they had were seen and had ear and sinus infection. States that started on Friday and been on going. Covid vaccine: original series  Flu vaccine: not  yet Has ben using tylenol dayquill and sinus pills. States that it will help for a sort peroid  Tdap: 2019  Flu: Update today Covid: Original series Pna: Too young Shingles: Too young HPV: think utd   Colonoscopy: Too young, currently average risk Mammogram: Too young, currently average risk Pap smear: this year and was normal  04/07/2022  Outpatient Encounter Medications as of 04/27/2023  Medication Sig   amoxicillin-clavulanate (AUGMENTIN) 875-125 MG tablet Take 1 tablet by mouth 2 (two) times daily for 7 days.   fluticasone (FLONASE) 50 MCG/ACT nasal spray Place 2 sprays into both nostrils daily.   norgestimate-ethinyl estradiol (ORTHO-CYCLEN) 0.25-35 MG-MCG tablet TAKE 1 TABLET BY MOUTH EVERY DAY   benzonatate (TESSALON) 100 MG capsule Take 1 capsule (100 mg total) by mouth every 8 (eight) hours. (Patient not taking: Reported on 11/17/2022)   promethazine-dextromethorphan (PROMETHAZINE-DM) 6.25-15 MG/5ML syrup Take 5 mLs by mouth 3 (three) times daily as needed for cough. (Patient not taking: Reported on 11/17/2022)   No facility-administered encounter medications on file as of 04/27/2023.    Past Medical History:  Diagnosis Date   Medical history non-contributory     Past Surgical History:  Procedure Laterality Date    CESAREAN SECTION N/A 04/14/2018   Procedure: CESAREAN SECTION;  Surgeon: Adam Phenix, MD;  Location: [facility] BIRTHING SU[facility];  Service: Obstetrics;  Laterality: N/A;   FOOT SURGERY Left 02/2021    Family History  Problem Relation Age of Onset   Healthy Mother    Heart attack Maternal Grandfather    Heart disease Maternal Grandfather    Heart attack Paternal Grandfather    Heart disease Paternal Grandfather    Stroke Paternal Grandfather     Social History   Socioeconomic History   Marital status: Married    Spouse name: Earna Coder   Number of children: 2   Years of education: Not on file   Highest education level: Not on file  Occupational History   Not on file  Tobacco Use   Smoking status: Never   Smokeless tobacco: Never   Tobacco comments:    Non smoker but gets second-hand smoke from job environment. (Works as a Teacher, adult education)  Advertising account planner   Vaping status: Never Used  Substance and Sexual Activity   Alcohol use: Not Currently    Comment: socailly   Drug use: No   Sexual activity: Yes    Birth control/protection: Pill  Other Topics Concern   Not on file  Social History Narrative   Married.   Works at a casino       [place] (5)      Armed forces logistics/support/administrative officer (3)   Social Determinants of Health   Financial Resource Strain: Low Risk  (04/10/2018)  Overall Financial Resource Strain (CARDIA)    Difficulty of Paying Living Expenses: Not hard at all  Food Insecurity: No Food Insecurity (04/10/2018)   Hunger Vital Sign    Worried About Running Out of Food in the Last Year: Never true    Ran Out of Food in the Last Year: Never true  Transportation Needs: Unknown (04/10/2018)   PRAPARE - Administrator, Civil Service (Medical): No    Lack of Transportation (Non-Medical): Not on file  Physical Activity: Not on file  Stress: Stress Concern Present (04/10/2018)   [employer] of Occupational Health - Occupational Stress Questionnaire    Feeling of Stress : To some extent   Social Connections: Not on file  Intimate Partner Violence: Not At Risk (04/10/2018)   Humiliation, Afraid, Rape, and Kick questionnaire    Fear of Current or Ex-Partner: No    Emotionally Abused: No    Physically Abused: No    Sexually Abused: No    Review of Systems  Constitutional:  Positive for chills, fever and malaise/fatigue.  HENT:  Positive for ear discharge, ear pain and sinus pain. Negative for sore throat.   Respiratory:  Negative for cough and shortness of breath.   Cardiovascular:  Negative for chest pain.  Gastrointestinal:  Negative for abdominal pain, diarrhea, nausea and vomiting.  Musculoskeletal:  Positive for myalgias.  Neurological:  Positive for headaches.        Objective    BP 112/80   Pulse 78   Temp 98.3 F (36.8 C) (Oral)   Ht 4' 11.05" (1.5 m)   Wt 137 lb 12.8 oz (62.5 kg)   SpO2 97%   BMI 27.79 kg/m   Physical Exam Vitals and nursing note reviewed.  Constitutional:      Appearance: Normal appearance.  HENT:     Right Ear: Tympanic membrane, ear canal and external ear normal. There is impacted cerumen.     Left Ear: Tympanic membrane, ear canal and external ear normal.     Nose:     Right Sinus: No maxillary sinus tenderness or frontal sinus tenderness.     Left Sinus: Frontal sinus tenderness present. No maxillary sinus tenderness.     Mouth/Throat:     Mouth: Mucous membranes are moist.     Pharynx: Oropharynx is clear.  Eyes:     Extraocular Movements: Extraocular movements intact.     Pupils: Pupils are equal, round, and reactive to light.  Cardiovascular:     Rate and Rhythm: Normal rate and regular rhythm.     Pulses: Normal pulses.     Heart sounds: Normal heart sounds.  Pulmonary:     Effort: Pulmonary effort is normal.     Breath sounds: Normal breath sounds.  Musculoskeletal:     Right lower leg: No edema.     Left lower leg: No edema.  Lymphadenopathy:     Cervical: No cervical adenopathy.  Skin:    General: Skin  is warm.  Neurological:     General: No focal deficit present.     Mental Status: She is alert.     Deep Tendon Reflexes:     Reflex Scores:      Bicep reflexes are 2+ on the right side and 2+ on the left side.      Patellar reflexes are 2+ on the right side and 2+ on the left side.    Comments: Bilateral upper and lower extremity strength 5/5  Psychiatric:  Mood and Affect: Mood normal.        Behavior: Behavior normal.        Thought Content: Thought content normal.        Judgment: Judgment normal.         Assessment & Plan:   Problem List Items Addressed This Visit       Respiratory   Acute non-recurrent frontal sinusitis    Treat with Augmentin 875-125 mg tablet 1 tablet twice daily.  COVID was negative in office      Relevant Medications   amoxicillin-clavulanate (AUGMENTIN) 875-125 MG tablet   fluticasone (FLONASE) 50 MCG/ACT nasal spray     Other   Depression, major, single episode, mild ([facility])    Patient states this was postpartum but since her youngest child is gotten older this is in remission.      Fever and chills - Primary    COVID test in office.  Can use over-the-counter analgesics as needed      Relevant Orders   POC COVID-19 BinaxNow   Acute cough    COVID test in office.      Left ear pain    Benign exam in office will do fluticasone nasal spray 2 sprays per nostril daily      Relevant Medications   fluticasone (FLONASE) 50 MCG/ACT nasal spray    Return in about 6 months (around 10/26/2023) for CPE and Labs.   Audria Nine, NP

## 2023-04-27 NOTE — Assessment & Plan Note (Signed)
Benign exam in office will do fluticasone nasal spray 2 sprays per nostril daily

## 2023-04-27 NOTE — Assessment & Plan Note (Signed)
COVID test in office.  Can use over-the-counter analgesics as needed

## 2023-04-27 NOTE — Assessment & Plan Note (Signed)
Patient states this was postpartum but since her youngest child is gotten older this is in remission.

## 2023-04-27 NOTE — Addendum Note (Signed)
Addended by: Melina Copa on: 04/27/2023 03:46 PM   Modules accepted: Orders

## 2023-04-27 NOTE — Assessment & Plan Note (Signed)
COVID test in office.

## 2023-04-27 NOTE — Assessment & Plan Note (Signed)
Treat with Augmentin 875-125 mg tablet 1 tablet twice daily.  COVID was negative in office

## 2023-05-19 ENCOUNTER — Other Ambulatory Visit: Payer: Self-pay | Admitting: Nurse Practitioner

## 2023-05-19 DIAGNOSIS — H9202 Otalgia, left ear: Secondary | ICD-10-CM

## 2023-06-05 ENCOUNTER — Other Ambulatory Visit: Payer: Self-pay | Admitting: Obstetrics & Gynecology

## 2023-06-06 ENCOUNTER — Encounter: Payer: Self-pay | Admitting: Obstetrics & Gynecology

## 2023-06-07 ENCOUNTER — Other Ambulatory Visit: Payer: Self-pay | Admitting: *Deleted

## 2023-06-07 DIAGNOSIS — Z3041 Encounter for surveillance of contraceptive pills: Secondary | ICD-10-CM

## 2023-06-07 MED ORDER — SLYND 4 MG PO TABS: 4.0000 mg | ORAL_TABLET | Freq: Every day | ORAL | 6 refills | Status: DC

## 2023-06-07 MED ORDER — NORGESTIMATE-ETH ESTRADIOL 0.25-35 MG-MCG PO TABS
1.0000 | ORAL_TABLET | Freq: Every day | ORAL | 2 refills | Status: DC
Start: 2023-06-07 — End: 2023-07-27

## 2023-06-22 ENCOUNTER — Encounter: Payer: Self-pay | Admitting: Obstetrics & Gynecology

## 2023-07-13 ENCOUNTER — Ambulatory Visit (HOSPITAL_COMMUNITY)
Admission: EM | Admit: 2023-07-13 | Discharge: 2023-07-13 | Disposition: A | Discharge status: Home / Self Care | Payer: BC Managed Care – PPO | Attending: Family Medicine | Admitting: Family Medicine

## 2023-07-13 ENCOUNTER — Encounter (HOSPITAL_COMMUNITY): Payer: Self-pay

## 2023-07-13 DIAGNOSIS — J069 Acute upper respiratory infection, unspecified: Secondary | ICD-10-CM | POA: Diagnosis not present

## 2023-07-13 LAB — POC COVID19/FLU A&B COMBO
Covid Antigen, POC: NEGATIVE
Influenza A Antigen, POC: NEGATIVE
Influenza B Antigen, POC: NEGATIVE

## 2023-07-13 NOTE — Discharge Instructions (Addendum)
 You have an upper respiratory infection. Most cases are due to a virus and do not require antibiotics for treatment.  Make sure to continue good oral hydration.  You can take tylenol for fever as needed Start Flonase daily for the next week and then as needed. You can use nasal saline spray multiple times daily as well.  You can use a daily antihistamine or guaifenesin as an expectorant but you need to be well hydrated for these medications to work.  Get adequate rest for recovery Maintain distance from others and wear a mask in public areas to avoid spread  If you start to experience shortness of breath, fevers that don't respond to medication, confusion, profound neck stiffness, or fainting, return to the urgent care or ED.

## 2023-07-13 NOTE — ED Triage Notes (Signed)
 Pt c/o cough, congestion, body aches, and loss of voice x2 days. States has had a fever since last night. Last tylenol was at 6am.

## 2023-07-13 NOTE — ED Provider Notes (Signed)
 [facility]    CSN: [id number] Arrival date & time: 07/13/23  9160      History   Chief Complaint Chief Complaint  Patient presents with   Cough   Fever    HPI Alicia Rich is a 28 y.o. female.   The patient is a 28 year old female presenting with 2 days of fever, chills, body aches, sinus pressure and drainage, and cough.  Her husband has been feeling the same symptoms.  She has had decreased hydration and oral intake but no nausea, vomiting, diarrhea.  She denies any rashes, neck stiffness, confusion, dizziness, chest pain, or shortness of breath.  The history is provided by the patient.  Cough Associated symptoms: chills, fever, myalgias and rhinorrhea   Associated symptoms: no chest pain, no diaphoresis, no ear pain, no rash, no shortness of breath and no wheezing   Fever Associated symptoms: chills, congestion, cough, myalgias and rhinorrhea   Associated symptoms: no chest pain, no diarrhea, no ear pain, no nausea, no rash and no vomiting     Past Medical History:  Diagnosis Date   Medical history non-contributory     Patient Active Problem List   Diagnosis Date Noted   Fever and chills 04/27/2023   Acute cough 04/27/2023   Left ear pain 04/27/2023   Acute non-recurrent frontal sinusitis 04/27/2023   Depression, major, single episode, 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 Authorizing Provider  norgestimate -ethinyl estradiol   (ORTHO-CYCLEN) 0.25-35 MG-MCG tablet Take 1 tablet by mouth daily. 06/07/23   Herchel Gloris LABOR, MD    Family History Family History  Problem Relation Age of Onset   Healthy Mother    Heart attack Maternal Grandfather    Heart disease Maternal Grandfather    Heart attack Paternal Grandfather    Heart disease Paternal Grandfather    Stroke Paternal Grandfather     Social History Social History   Tobacco Use   Smoking status: Never   Smokeless tobacco: Never   Tobacco comments:    Non smoker but gets second-hand smoke from job environment. 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   Review of Systems Review of Systems  Constitutional:  Positive for appetite change, chills and fever. Negative for diaphoresis and fatigue.  HENT:  Positive for congestion, postnasal drip, rhinorrhea and sinus pressure. Negative for ear pain, facial swelling, hearing loss, sinus pain and trouble swallowing.   Eyes:  Negative for photophobia and visual disturbance.  Respiratory:  Positive for cough.  Negative for apnea, shortness of breath and wheezing.   Cardiovascular:  Negative for chest pain and palpitations.  Gastrointestinal:  Negative for abdominal pain, diarrhea, nausea and vomiting.  Musculoskeletal:  Positive for myalgias. Negative for arthralgias, neck pain and neck stiffness.  Skin:  Negative for rash.  Neurological:  Negative for dizziness and light-headedness.     Physical Exam Triage Vital Signs ED Triage Vitals  Encounter Vitals Group     BP 07/13/23 0913 103/75     Systolic BP Percentile --      Diastolic BP Percentile --      Pulse Rate 07/13/23 0913 94     Resp 07/13/23 0913 16     Temp 07/13/23 0913 98.7 F (37.1 C)     Temp Source 07/13/23 0913 Oral     SpO2 07/13/23 0913 97 %     Weight --       Height --      Head Circumference --      Peak Flow --      Pain Score 07/13/23 0914 7     Pain Loc --      Pain Education --      Exclude from Growth Chart --    No data found.  Updated Vital Signs BP 103/75 (BP Location: Right Arm)   Pulse 94   Temp 98.7 F (37.1 C) (Oral)   Resp 16   SpO2 97%   Visual Acuity Right Eye Distance:   Left Eye Distance:   Bilateral Distance:    Right Eye Near:   Left Eye Near:    Bilateral Near:     Physical Exam Vitals reviewed.  Constitutional:      General: She is not in acute distress.    Appearance: Normal appearance. She is normal weight. She is not ill-appearing, toxic-appearing or diaphoretic.  HENT:     Head: Normocephalic and atraumatic.     Right Ear: Ear canal normal.     Left Ear: Ear canal normal.     Ears:     Comments: Clear effusion bilaterally    Nose: Congestion present.     Mouth/Throat:     Mouth: Mucous membranes are moist.     Comments: Clear postnasal drainage present with no cobblestoning All structures are midline No erythema or ulcers Eyes:     General:        Right eye: No discharge.        Left eye: No discharge.     Extraocular Movements: Extraocular movements intact.     Pupils: Pupils are equal, round, and reactive to light.  Cardiovascular:     Rate and Rhythm: Normal rate and regular rhythm.     Pulses: Normal pulses.     Heart sounds: No murmur heard.    No gallop.  Pulmonary:     Effort: Pulmonary effort is normal. No respiratory distress.     Breath sounds: Normal breath sounds. No wheezing, rhonchi or rales.  Abdominal:     General: Abdomen is flat. Bowel sounds are normal.     Tenderness: There is no abdominal tenderness.  Musculoskeletal:     Cervical back: Normal range of motion and neck supple. No rigidity.  Skin:    General: Skin is warm.     Capillary Refill: Capillary refill takes more than 3 seconds.  Neurological:     General: No focal deficit present.     Mental Status:  She is alert.  Psychiatric:  Mood and Affect: Mood normal.        Behavior: Behavior normal.      UC Treatments / Results  Labs (all labs ordered are listed, but only abnormal results are displayed) Labs Reviewed  POC COVID19/FLU A&B COMBO    EKG   Radiology No results found.  Procedures Procedures (including critical care time)  Medications Ordered in UC Medications - No data to display  Initial Impression / Assessment and Plan / UC Course  I have reviewed the triage vital signs and the nursing notes.  Pertinent labs & imaging results that were available during my care of the patient were reviewed by me and considered in my medical decision making (see chart for details).     Upper respiratory infection - COVID/influenza A/B negative in clinic - We discussed further symptomatic management.  Teacher, english as a foreign language given. - I counseled the patient on signs and symptoms to watch for for worsening respiratory infection.  If she develops ear pain or sinus pain that continues to worsen over the next week she can call for antibiotic therapy. - The patient voiced understanding and agreement with plan.   Final Clinical Impressions(s) / UC Diagnoses   Final diagnoses:  Viral upper respiratory tract infection     Discharge Instructions      You have an upper respiratory infection. Most cases are due to a virus and do not require antibiotics for treatment.  Make sure to continue good oral hydration.  You can take tylenol  for fever as needed Start Flonase  daily for the next week and then as needed. You can use nasal saline spray multiple times daily as well.  You can use a daily antihistamine or guaifenesin as an expectorant but you need to be well hydrated for these medications to work.  Get adequate rest for recovery Maintain distance from others and wear a mask in public areas to avoid spread  If you start to experience shortness of breath, fevers that don't respond  to medication, confusion, profound neck stiffness, or fainting, return to the urgent care or ED.       ED Prescriptions   None    PDMP not reviewed this encounter.   Janet Lonni BRAVO, MD 07/13/23 1034

## 2023-07-27 ENCOUNTER — Encounter: Payer: Self-pay | Admitting: Obstetrics & Gynecology

## 2023-07-27 ENCOUNTER — Other Ambulatory Visit (HOSPITAL_COMMUNITY)
Admission: RE | Admit: 2023-07-27 | Discharge: 2023-07-27 | Disposition: A | Discharge status: Home / Self Care | Payer: BC Managed Care – PPO | Source: Ambulatory Visit | Attending: Obstetrics & Gynecology | Admitting: Obstetrics & Gynecology

## 2023-07-27 ENCOUNTER — Ambulatory Visit (INDEPENDENT_AMBULATORY_CARE_PROVIDER_SITE_OTHER): Payer: BC Managed Care – PPO | Admitting: Obstetrics & Gynecology

## 2023-07-27 VITALS — BP 100/63 | HR 84 | Wt 138.0 lb

## 2023-07-27 DIAGNOSIS — R102 Pelvic and perineal pain: Secondary | ICD-10-CM | POA: Diagnosis not present

## 2023-07-27 DIAGNOSIS — N946 Dysmenorrhea, unspecified: Secondary | ICD-10-CM | POA: Diagnosis not present

## 2023-07-27 DIAGNOSIS — Z3041 Encounter for surveillance of contraceptive pills: Secondary | ICD-10-CM

## 2023-07-27 DIAGNOSIS — R5383 Other fatigue: Secondary | ICD-10-CM

## 2023-07-27 MED ORDER — NORGESTIMATE-ETH ESTRADIOL 0.25-35 MG-MCG PO TABS
1.0000 | ORAL_TABLET | Freq: Every day | ORAL | 5 refills | Status: DC
Start: 2023-07-27 — End: 2023-08-16

## 2023-07-27 NOTE — Progress Notes (Signed)
   GYNECOLOGY OFFICE VISIT NOTE  History:   Alicia Rich is a 28 y.o. [phone] here today for evaluation and management of heavy and painful periods since November 2024.  She is on Ortho Cyclen for birth control, this happens during the placebo week.  Also Reports feeling tired, having fatigue.  She is worried she has endometriosis, has family history of this.  She denies any current abnormal vaginal discharge, bleeding, pelvic pain or other concerns.    Past Medical History:  Diagnosis Date   Medical history non-contributory     Past Surgical History:  Procedure Laterality Date   CESAREAN SECTION N/A 04/14/2018   Procedure: CESAREAN SECTION;  Surgeon: Adam Phenix, MD;  Location: [facility]IRTHING SUIT[facility];  Service: Obstetrics;  Laterality: N/A;   FOOT SURGERY Left 02/2021    The following portions of the patient's history were reviewed and updated as appropriate: allergies, current medications, past family history, past medical history, past social history, past surgical history and problem list.   Health Maintenance:  Normal pap on 04/07/2022.   Review of Systems:  Pertinent items noted in HPI and remainder of comprehensive ROS otherwise negative.  Physical Exam:  BP 100/63   Pulse 84   Wt 138 lb (62.6 kg)   BMI 27.83 kg/m  CONSTITUTIONAL: Well-developed, well-nourished female in no acute distress.  HEENT:  Normocephalic, atraumatic. External right and left ear normal. No scleral icterus.  NECK: Normal range of motion, supple, no masses noted on observation SKIN: No rash noted. Not diaphoretic. No erythema. No pallor. MUSCULOSKELETAL: Normal range of motion. No edema noted. NEUROLOGIC: Alert and oriented to person, place, and time. Normal muscle tone coordination. No cranial nerve deficit noted. PSYCHIATRIC: Normal mood and affect. Normal behavior. Normal judgment and thought content. CARDIOVASCULAR: Normal heart rate noted RESPIRATORY: Effort and breath sounds normal,  no problems with respiration noted ABDOMEN: No masses noted. No other overt distention noted.   PELVIC: Deferred     Assessment and Plan:     1. Fatigue Unsure etiology, will check labs. If labs are okay, may need to discuss with PCP/mental health provider especially given association with mood swings.  - CBC - TSH Rfx on Abnormal to Free T4  2. Uses oral contraception 3. Painful menstruation 4. Pelvic pain (Primary) Unclear etiology, will obtain testing for infections, also obtain ultrasound. will follow up results and manage accordingly.  NSAIDs recommended for symptoms for now.  Given that her symptoms occur during her placebo week, recommended eliminating this for now. Will do trial of continuous OCPs.  If symptoms continue, can try Orilissa/MyFembree or consider laparoscopy.  Will monitor response, reevaluate soon.  - norgestimate-ethinyl estradiol (ORTHO-CYCLEN) 0.25-35 MG-MCG tablet; Take 1 tablet by mouth daily. Take only hormonal active pills in a continuous fashion. Do not take placebo pills.  Dispense: 84 tablet; Refill: 5 - US PELVIC COMPLETE WITH TRANSVAGINAL; Future - Cervicovaginal ancillary only   Routine preventative health maintenance measures emphasized. Please refer to After Visit Summary for other counseling recommendations.   Return in about 1 month (around 08/27/2023) for Discuss ultrasound results, follow up pelvic pain.    I spent 30 minutes dedicated to the care of this patient including pre-visit review of records, face to face time with the patient discussing her conditions and treatments and post visit orders.    Jaynie Collins, MD, FACOG Obstetrician & Gynecologist, [facility] for [employer], [facility]

## 2023-07-28 ENCOUNTER — Encounter: Payer: Self-pay | Admitting: Obstetrics & Gynecology

## 2023-07-28 LAB — CBC
Hematocrit: 41.2 % (ref 34.0–46.6)
Hemoglobin: 13.3 g/dL (ref 11.1–15.9)
MCH: 28.9 pg (ref 26.6–33.0)
MCHC: 32.3 g/dL (ref 31.5–35.7)
MCV: 90 fL (ref 79–97)
Platelets: 353 10*3/uL (ref 150–450)
RBC: 4.6 x10E6/uL (ref 3.77–5.28)
RDW: 11.8 % (ref 11.7–15.4)
WBC: 5.6 10*3/uL (ref 3.4–10.8)

## 2023-07-28 LAB — CERVICOVAGINAL ANCILLARY ONLY
Bacterial Vaginitis (gardnerella): NEGATIVE
Candida Glabrata: NEGATIVE
Candida Vaginitis: NEGATIVE
Chlamydia: NEGATIVE
Comment: NEGATIVE
Comment: NEGATIVE
Comment: NEGATIVE
Comment: NEGATIVE
Comment: NEGATIVE
Comment: NORMAL
Neisseria Gonorrhea: NEGATIVE
Trichomonas: NEGATIVE

## 2023-07-28 LAB — TSH RFX ON ABNORMAL TO FREE T4: TSH: 1.09 u[IU]/mL (ref 0.450–4.500)

## 2023-08-03 ENCOUNTER — Ambulatory Visit
Admission: RE | Admit: 2023-08-03 | Discharge: 2023-08-03 | Disposition: A | Discharge status: Home / Self Care | Payer: BC Managed Care – PPO | Source: Ambulatory Visit | Attending: Obstetrics & Gynecology | Admitting: Obstetrics & Gynecology

## 2023-08-03 DIAGNOSIS — N946 Dysmenorrhea, unspecified: Secondary | ICD-10-CM | POA: Diagnosis not present

## 2023-08-03 DIAGNOSIS — R102 Pelvic and perineal pain: Secondary | ICD-10-CM | POA: Diagnosis not present

## 2023-08-03 DIAGNOSIS — N854 Malposition of uterus: Secondary | ICD-10-CM | POA: Diagnosis not present

## 2023-08-04 ENCOUNTER — Encounter: Payer: Self-pay | Admitting: Obstetrics & Gynecology

## 2023-08-16 ENCOUNTER — Encounter: Payer: Self-pay | Admitting: Obstetrics & Gynecology

## 2023-08-16 ENCOUNTER — Other Ambulatory Visit: Payer: Self-pay | Admitting: *Deleted

## 2023-08-16 DIAGNOSIS — Z3041 Encounter for surveillance of contraceptive pills: Secondary | ICD-10-CM

## 2023-08-16 MED ORDER — NORGESTIMATE-ETH ESTRADIOL 0.25-35 MG-MCG PO TABS
1.0000 | ORAL_TABLET | Freq: Every day | ORAL | 5 refills | Status: AC
Start: 2023-08-16 — End: ?

## 2023-08-16 NOTE — Progress Notes (Signed)
RX expired by the time patient ordered it , resending

## 2023-08-31 ENCOUNTER — Ambulatory Visit: Payer: BC Managed Care – PPO | Admitting: Obstetrics & Gynecology

## 2023-09-13 ENCOUNTER — Ambulatory Visit: Payer: BC Managed Care – PPO | Admitting: Family Medicine

## 2023-10-26 ENCOUNTER — Ambulatory Visit (INDEPENDENT_AMBULATORY_CARE_PROVIDER_SITE_OTHER): Payer: BC Managed Care – PPO | Admitting: Nurse Practitioner

## 2023-10-26 ENCOUNTER — Encounter: Payer: Self-pay | Admitting: Nurse Practitioner

## 2023-10-26 VITALS — BP 100/70 | HR 85 | Temp 98.2°F | Ht 59.95 in | Wt 148.0 lb

## 2023-10-26 DIAGNOSIS — Z1159 Encounter for screening for other viral diseases: Secondary | ICD-10-CM | POA: Diagnosis not present

## 2023-10-26 DIAGNOSIS — R454 Irritability and anger: Secondary | ICD-10-CM

## 2023-10-26 DIAGNOSIS — Z13220 Encounter for screening for lipoid disorders: Secondary | ICD-10-CM

## 2023-10-26 DIAGNOSIS — Z131 Encounter for screening for diabetes mellitus: Secondary | ICD-10-CM

## 2023-10-26 DIAGNOSIS — Z0000 Encounter for general adult medical examination without abnormal findings: Secondary | ICD-10-CM | POA: Diagnosis not present

## 2023-10-26 LAB — CBC
HCT: 40.7 % (ref 36.0–46.0)
Hemoglobin: 13.6 g/dL (ref 12.0–15.0)
MCHC: 33.3 g/dL (ref 30.0–36.0)
MCV: 88 fl (ref 78.0–100.0)
Platelets: 293 10*3/uL (ref 150.0–400.0)
RBC: 4.62 Mil/uL (ref 3.87–5.11)
RDW: 12.6 % (ref 11.5–15.5)
WBC: 5.7 10*3/uL (ref 4.0–10.5)

## 2023-10-26 LAB — LIPID PANEL
Cholesterol: 201 mg/dL — ABNORMAL HIGH (ref 0–200)
HDL: 73.9 mg/dL (ref >39.00)
LDL Cholesterol: 106 mg/dL — ABNORMAL HIGH (ref 0–99)
NonHDL: 127.32
Total CHOL/HDL Ratio: 3
Triglycerides: 106 mg/dL (ref 0.0–149.0)
VLDL: 21.2 mg/dL (ref 0.0–40.0)

## 2023-10-26 LAB — COMPREHENSIVE METABOLIC PANEL WITH GFR
ALT: 14 U/L (ref 0–35)
AST: 13 U/L (ref 0–37)
Albumin: 4.2 g/dL (ref 3.5–5.2)
Alkaline Phosphatase: 40 U/L (ref 39–117)
BUN: 12 mg/dL (ref 6–23)
CO2: 27 meq/L (ref 19–32)
Calcium: 9.1 mg/dL (ref 8.4–10.5)
Chloride: 103 meq/L (ref 96–112)
Creatinine, Ser: 0.66 mg/dL (ref 0.40–1.20)
GFR: 120.37 mL/min (ref >60.00)
Glucose, Bld: 89 mg/dL (ref 70–99)
Potassium: 4.5 meq/L (ref 3.5–5.1)
Sodium: 136 meq/L (ref 135–145)
Total Bilirubin: 0.4 mg/dL (ref 0.2–1.2)
Total Protein: 7.1 g/dL (ref 6.0–8.3)

## 2023-10-26 LAB — HEMOGLOBIN A1C: Hgb A1c MFr Bld: 5.2 % (ref 4.6–6.5)

## 2023-10-26 LAB — TSH: TSH: 2.09 u[IU]/mL (ref 0.35–5.50)

## 2023-10-26 MED ORDER — SERTRALINE HCL 50 MG PO TABS
ORAL_TABLET | ORAL | 0 refills | Status: DC
Start: 2023-10-26 — End: 2023-11-17

## 2023-10-26 NOTE — Progress Notes (Signed)
 Established Patient Office Visit  Subjective   Patient ID: Alicia Rich, female    DOB: [birth year]  Age: 28 y.o. MRN: [id number]  Chief Complaint  Patient presents with   Annual Exam    HPI  for complete physical and follow up of chronic conditions.  Immunizations: -Tetanus: Completed in 2019 -Influenza: 04/27/2023 -Shingles: too young -Pneumonia: too young    Diet: Fair diet. She is eating 3 meals a day. With snacks. She is drinking coffee water soda and tea  Exercise: No regular exercise. Walks a lot at work   [employer] exam: Completes annually. Wears glasses Dental exam: Needs updating    Colonoscopy: too young, currently average risk  Lung Cancer Screening: NA  Pap Smear: GYN Dr. Macon Large last pap 04/07/2022 that was normal due 2026  Mammogram: too young   Dexa: too young   Sleep: she does work night shift. Gets out at 6 ad wakes up at 2pm. She does feel rested. Does not snore   STI: recently tested with GYN that was negative        Review of Systems  Constitutional:  Negative for chills and fever.  Respiratory:  Negative for shortness of breath.   Cardiovascular:  Negative for chest pain and leg swelling.  Gastrointestinal:  Negative for abdominal pain, blood in stool, constipation, diarrhea, nausea and vomiting.       BM every other day   Genitourinary:  Negative for dysuria and hematuria.  Neurological:  Negative for tingling and headaches.  Psychiatric/Behavioral:  Negative for hallucinations and suicidal ideas.       Objective:     BP 100/70   Pulse 85   Temp 98.2 F (36.8 C) (Oral)   Ht 4' 11.95" (1.523 m)   Wt 148 lb (67.1 kg)   LMP 10/23/2023 (Approximate) Comment: spotting  SpO2 96%   BMI 28.95 kg/m  BP Readings from Last 3 Encounters:  10/26/23 100/70  07/27/23 100/63  07/13/23 103/75   Wt Readings from Last 3 Encounters:  10/26/23 148 lb (67.1 kg)  07/27/23 138 lb (62.6 kg)  04/27/23 137 lb 12.8 oz (62.5 kg)   SpO2  Readings from Last 3 Encounters:  10/26/23 96%  07/13/23 97%  04/27/23 97%      Physical Exam Vitals and nursing note reviewed.  Constitutional:      Appearance: Normal appearance.  HENT:     Right Ear: Tympanic membrane, ear canal and external ear normal.     Left Ear: Tympanic membrane, ear canal and external ear normal.     Mouth/Throat:     Mouth: Mucous membranes are moist.     Pharynx: Oropharynx is clear.  Eyes:     Extraocular Movements: Extraocular movements intact.     Pupils: Pupils are equal, round, and reactive to light.  Cardiovascular:     Rate and Rhythm: Normal rate and regular rhythm.     Pulses: Normal pulses.     Heart sounds: Normal heart sounds.  Pulmonary:     Effort: Pulmonary effort is normal.     Breath sounds: Normal breath sounds.  Abdominal:     General: Bowel sounds are normal. There is no distension.     Palpations: There is no mass.     Tenderness: There is no abdominal tenderness.     Hernia: No hernia is present.  Musculoskeletal:     Right lower leg: No edema.     Left lower leg: No edema.  Lymphadenopathy:  Cervical: No cervical adenopathy.  Skin:    General: Skin is warm.  Neurological:     General: No focal deficit present.     Mental Status: She is alert.     Deep Tendon Reflexes:     Reflex Scores:      Bicep reflexes are 2+ on the right side and 2+ on the left side.      Patellar reflexes are 2+ on the right side and 2+ on the left side.    Comments: Bilateral upper and lower extremity strength 5/5  Psychiatric:        Mood and Affect: Mood normal.        Behavior: Behavior normal.        Thought Content: Thought content normal.        Judgment: Judgment normal.      No results found for any visits on 10/26/23.    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  Reviewed patient's personal, surgical, social, family histories.  Patient is up-to-date on all age-appropriate vaccinations she would like.  Patient is too young for CRC screening, breast cancer screening.  Patient is up-to-date on cervical cancer screening.  Patient declined STI testing she was recently tested with GYN in January 2025.  Patient was given information at discharge about preventative healthcare maintenance with anticipatory guidance.      Relevant Orders   CBC   Comprehensive metabolic panel with GFR   TSH   Irritability   Patient having mood changes more so around her menstrual cycle.  But it is starting to affect her work life and personal life.  She does have a history of postpartum depression.  Will start patient on sertraline 25 mg daily for 2 weeks then titrate up to 50 mg daily thereafter.  Patient denies HI/SI/AVH.      Relevant Medications   sertraline (ZOLOFT) 50 MG tablet   Other Visit Diagnoses       Encounter for hepatitis C screening test for low risk patient       Relevant Orders   Hepatitis C Antibody     Screening for diabetes mellitus       Relevant Orders   Hemoglobin A1c     Screening for lipid disorders       Relevant Orders   Lipid panel       Return in about 6 weeks (around 12/07/2023) for Mood recheck .    Margarie Shay, NP

## 2023-10-26 NOTE — Assessment & Plan Note (Signed)
 Patient having mood changes more so around her menstrual cycle.  But it is starting to affect her work life and personal life.  She does have a history of postpartum depression.  Will start patient on sertraline 25 mg daily for 2 weeks then titrate up to 50 mg daily thereafter.  Patient denies HI/SI/AVH.

## 2023-10-26 NOTE — Assessment & Plan Note (Signed)
 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  Reviewed patient's personal, surgical, social, family histories.  Patient is up-to-date on all age-appropriate vaccinations she would like.  Patient is too young for CRC screening, breast cancer screening.  Patient is up-to-date on cervical cancer screening.  Patient declined STI testing she was recently tested with GYN in January 2025.  Patient was given information at discharge about preventative healthcare maintenance with anticipatory guidance.

## 2023-10-26 NOTE — Patient Instructions (Signed)
 Nice to see you today I will be in touch with the labs once I have them Follow up with me in 6 weeks either virtually or in person

## 2023-10-27 LAB — HEPATITIS C ANTIBODY: Hepatitis C Ab: NONREACTIVE

## 2023-10-31 ENCOUNTER — Encounter: Payer: Self-pay | Admitting: Nurse Practitioner

## 2023-11-17 ENCOUNTER — Other Ambulatory Visit: Payer: Self-pay | Admitting: Nurse Practitioner

## 2023-11-17 DIAGNOSIS — R454 Irritability and anger: Secondary | ICD-10-CM

## 2023-11-24 ENCOUNTER — Telehealth (INDEPENDENT_AMBULATORY_CARE_PROVIDER_SITE_OTHER): Admitting: Nurse Practitioner

## 2023-11-24 DIAGNOSIS — R454 Irritability and anger: Secondary | ICD-10-CM | POA: Diagnosis not present

## 2023-11-24 MED ORDER — SERTRALINE HCL 50 MG PO TABS
50.0000 mg | ORAL_TABLET | Freq: Every day | ORAL | 3 refills | Status: AC
Start: 2023-11-24 — End: ?

## 2023-11-24 NOTE — Assessment & Plan Note (Signed)
 Patient currently maintained on sertraline  50 mg daily.  She is tolerating the medication well at this juncture.  She is getting good benefit.  PHQ-9 GAD-7 is in office.  Patient denies HI/SI/AVH.  Continue medication as prescribed

## 2023-11-24 NOTE — Progress Notes (Signed)
 Ph: [phone] Fax: [phone]   Patient ID: Alicia Rich, female    DOB: 11/02/1995, 28 y.o.   MRN: [id number]  Virtual visit completed through MyChart, a video enabled telemedicine application. Due to national recommendations of social distancing due to COVID-19, a virtual visit is felt to be most appropriate for this patient at this time. Reviewed limitations, risks, security and privacy concerns of performing a virtual visit and the availability of in person appointments. I also reviewed that there may be a patient responsible charge related to this service. The patient agreed to proceed.   Patient location: home Provider location: [place] [facility], office Persons participating in this virtual visit: patient, provider   If any vitals were documented, they were collected by patient at home unless specified below.    Ht 4' 11.95" (1.523 m) Comment: per chart  LMP 10/23/2023 (Approximate) Comment: spotting  BMI 28.95 kg/m    CC: Irritability/mood Subjective:   HPI: Alicia Rich is a 28 y.o. female presenting on 11/24/2023 for Follow-up (Pt complains of of doing pretty good over the last 6 weeks. ) and Menstrual Problem (Pt complains that she is taking Birth control continuously but is spotting off and on for a week. Would like to know if that is normal. )   Irritability: Patient was seen by me on 10/26/2023 patient was having mood changes more so around her menstrual cycle.  She was concerned as a starting affect her work and personal life.  Patient did have a history of postpartum depression.  Patient was started on sertraline  25 mg daily for 2 weeks then we titrated up to 50 mg daily thereafter.  Follow-up  States that she has been feeling good. States that the first week was rough. States that she had some nausea and then it has resolved. States that she is sleeping well.  Feels like she is back to her normal.  She denies any HI/SI/AVH.      11/24/2023    9:41 AM 10/26/2023    8:54 AM 04/27/2023    2:53 PM  PHQ9 SCORE ONLY  PHQ-9 Total Score 3 4 7        11/24/2023    9:43 AM 10/26/2023    8:55 AM  GAD 7 : Generalized Anxiety Score  Nervous, Anxious, on Edge 0 2  Control/stop worrying 0 2  Worry too much - different things 0 2  Trouble relaxing 0 2  Restless 0 0  Easily annoyed or irritable 0 2  Afraid - awful might happen 0 0  Total GAD 7 Score 0 10  Anxiety Difficulty Not difficult at all           Relevant past medical, surgical, family and social history reviewed and updated as indicated. Interim medical history since our last visit reviewed. Allergies and medications reviewed and updated. Outpatient Medications Prior to Visit  Medication Sig Dispense Refill   norgestimate -ethinyl estradiol  (ORTHO-CYCLEN) 0.25-35 MG-MCG tablet Take 1 tablet by mouth daily. Take only hormonal active pills in a continuous fashion. Do not take placebo pills. 84 tablet 5   sertraline  (ZOLOFT ) 50 MG tablet Take 1 tablet (50 mg total) by mouth daily. 30 tablet 1   No facility-administered medications prior to visit.     Per HPI unless specifically indicated in ROS section below Review of Systems  Constitutional:  Negative for chills and fever.  Gastrointestinal:  Negative for nausea.  Neurological:  Negative for headaches.  Psychiatric/Behavioral:  Negative for hallucinations,  sleep disturbance and suicidal ideas.    Objective:  Ht 4' 11.95" (1.523 m) Comment: per chart  LMP 10/23/2023 (Approximate) Comment: spotting  BMI 28.95 kg/m   Wt Readings from Last 3 Encounters:  10/26/23 148 lb (67.1 kg)  07/27/23 138 lb (62.6 kg)  04/27/23 137 lb 12.8 oz (62.5 kg)       Physical exam: Gen: alert, NAD, not ill appearing Pulm: speaks in complete sentences without increased work of breathing Psych: normal mood, normal thought content      Results for orders placed or performed in visit on 10/26/23  Hepatitis C Antibody    Collection Time: 10/26/23  9:27 AM  Result Value Ref Range   Hepatitis C Ab NON-REACTIVE NON-REACTIVE  CBC   Collection Time: 10/26/23  9:27 AM  Result Value Ref Range   WBC 5.7 4.0 - 10.5 K/uL   RBC 4.62 3.87 - 5.11 Mil/uL   Platelets 293.0 150.0 - 400.0 K/uL   Hemoglobin 13.6 12.0 - 15.0 g/dL   HCT 72.5 36.6 - 44.0 %   MCV 88.0 78.0 - 100.0 fl   MCHC 33.3 30.0 - 36.0 g/dL   RDW 34.7 42.5 - 95.6 %  Comprehensive metabolic panel with GFR   Collection Time: 10/26/23  9:27 AM  Result Value Ref Range   Sodium 136 135 - 145 mEq/L   Potassium 4.5 3.5 - 5.1 mEq/L   Chloride 103 96 - 112 mEq/L   CO2 27 19 - 32 mEq/L   Glucose, Bld 89 70 - 99 mg/dL   BUN 12 6 - 23 mg/dL   Creatinine, Ser 3.87 0.40 - 1.20 mg/dL   Total Bilirubin 0.4 0.2 - 1.2 mg/dL   Alkaline Phosphatase 40 39 - 117 U/L   AST 13 0 - 37 U/L   ALT 14 0 - 35 U/L   Total Protein 7.1 6.0 - 8.3 g/dL   Albumin 4.2 3.5 - 5.2 g/dL   GFR 564.33 >29.51 mL/min   Calcium 9.1 8.4 - 10.5 mg/dL  Hemoglobin O8C   Collection Time: 10/26/23  9:27 AM  Result Value Ref Range   Hgb A1c MFr Bld 5.2 4.6 - 6.5 %  TSH   Collection Time: 10/26/23  9:27 AM  Result Value Ref Range   TSH 2.09 0.35 - 5.50 uIU/mL  Lipid panel   Collection Time: 10/26/23  9:27 AM  Result Value Ref Range   Cholesterol 201 (H) 0 - 200 mg/dL   Triglycerides 166.0 0.0 - 149.0 mg/dL   HDL 63.01 >60.10 mg/dL   VLDL 93.2 0.0 - 35.5 mg/dL   LDL Cholesterol 732 (H) 0 - 99 mg/dL   Total CHOL/HDL Ratio 3    NonHDL 127.32    Assessment & Plan:   Irritability -     Sertraline  HCl; Take 1 tablet (50 mg total) by mouth daily.  Dispense: 90 tablet; Refill: 3     I discussed the assessment and treatment plan with the patient. The patient was provided an opportunity to ask questions and all were answered. The patient agreed with the plan and demonstrated an understanding of the instructions. The patient was advised to call back or seek an in-person evaluation if the  symptoms worsen or if the condition fails to improve as anticipated.  Follow up plan: No follow-ups on file.  Margarie Shay, NP

## 2023-12-27 ENCOUNTER — Ambulatory Visit: Admitting: General Practice

## 2024-01-19 ENCOUNTER — Ambulatory Visit: Admitting: Nurse Practitioner
